# Patient Record
Sex: Female | Born: 1955 | Race: Black or African American | Hispanic: No | Marital: Single | Smoking: Former smoker
Health system: Southern US, Community
[De-identification: ages and names within clinical notes are randomized; demographics above are authoritative.]

## PROBLEM LIST (undated history)

## (undated) DIAGNOSIS — Z5189 Encounter for other specified aftercare: Secondary | ICD-10-CM

## (undated) DIAGNOSIS — E119 Type 2 diabetes mellitus without complications: Secondary | ICD-10-CM

## (undated) DIAGNOSIS — I82409 Acute embolism and thrombosis of unspecified deep veins of unspecified lower extremity: Secondary | ICD-10-CM

## (undated) DIAGNOSIS — M549 Dorsalgia, unspecified: Secondary | ICD-10-CM

## (undated) HISTORY — PX: IVC FILTER PLACEMENT (ARMC HX): HXRAD1551

## (undated) HISTORY — PX: ABDOMINAL HYSTERECTOMY: SHX81

## (undated) HISTORY — PX: FRACTURE SURGERY: SHX138

## (undated) HISTORY — PX: CHOLECYSTECTOMY: SHX55

## (undated) HISTORY — DX: Encounter for other specified aftercare: Z51.89

---

## 2014-12-25 ENCOUNTER — Encounter (HOSPITAL_COMMUNITY): Payer: Self-pay | Admitting: Emergency Medicine

## 2014-12-25 ENCOUNTER — Emergency Department (HOSPITAL_COMMUNITY): Payer: Medicare Other

## 2014-12-25 ENCOUNTER — Emergency Department (HOSPITAL_COMMUNITY)
Admission: EM | Admit: 2014-12-25 | Discharge: 2014-12-25 | Disposition: A | Discharge status: Home / Self Care | Payer: Medicare Other | Attending: Emergency Medicine | Admitting: Emergency Medicine

## 2014-12-25 DIAGNOSIS — I82409 Acute embolism and thrombosis of unspecified deep veins of unspecified lower extremity: Secondary | ICD-10-CM | POA: Insufficient documentation

## 2014-12-25 DIAGNOSIS — Z79899 Other long term (current) drug therapy: Secondary | ICD-10-CM | POA: Insufficient documentation

## 2014-12-25 DIAGNOSIS — K115 Sialolithiasis: Secondary | ICD-10-CM | POA: Diagnosis not present

## 2014-12-25 DIAGNOSIS — I509 Heart failure, unspecified: Secondary | ICD-10-CM | POA: Insufficient documentation

## 2014-12-25 DIAGNOSIS — Z87891 Personal history of nicotine dependence: Secondary | ICD-10-CM | POA: Insufficient documentation

## 2014-12-25 DIAGNOSIS — E119 Type 2 diabetes mellitus without complications: Secondary | ICD-10-CM | POA: Diagnosis not present

## 2014-12-25 DIAGNOSIS — Z792 Long term (current) use of antibiotics: Secondary | ICD-10-CM | POA: Insufficient documentation

## 2014-12-25 DIAGNOSIS — R221 Localized swelling, mass and lump, neck: Secondary | ICD-10-CM | POA: Diagnosis present

## 2014-12-25 HISTORY — DX: Dorsalgia, unspecified: M54.9

## 2014-12-25 HISTORY — DX: Acute embolism and thrombosis of unspecified deep veins of unspecified lower extremity: I82.409

## 2014-12-25 HISTORY — DX: Type 2 diabetes mellitus without complications: E11.9

## 2014-12-25 LAB — CBC WITH DIFFERENTIAL/PLATELET
Basophils Absolute: 0 10*3/uL (ref 0.0–0.1)
Basophils Relative: 0 % (ref 0–1)
Eosinophils Absolute: 0 10*3/uL (ref 0.0–0.7)
Eosinophils Relative: 0 % (ref 0–5)
HEMATOCRIT: 37.8 % (ref 36.0–46.0)
Hemoglobin: 12.3 g/dL (ref 12.0–15.0)
LYMPHS ABS: 2.5 10*3/uL (ref 0.7–4.0)
LYMPHS PCT: 28 % (ref 12–46)
MCH: 29.9 pg (ref 26.0–34.0)
MCHC: 32.5 g/dL (ref 30.0–36.0)
MCV: 91.7 fL (ref 78.0–100.0)
MONO ABS: 0.7 10*3/uL (ref 0.1–1.0)
Monocytes Relative: 8 % (ref 3–12)
Neutro Abs: 5.7 10*3/uL (ref 1.7–7.7)
Neutrophils Relative %: 64 % (ref 43–77)
PLATELETS: 212 10*3/uL (ref 150–400)
RBC: 4.12 MIL/uL (ref 3.87–5.11)
RDW: 17.8 % — ABNORMAL HIGH (ref 11.5–15.5)
WBC: 8.9 10*3/uL (ref 4.0–10.5)

## 2014-12-25 LAB — PROTIME-INR
INR: 1.87 — ABNORMAL HIGH (ref 0.00–1.49)
Prothrombin Time: 21.4 seconds — ABNORMAL HIGH (ref 11.6–15.2)

## 2014-12-25 LAB — BASIC METABOLIC PANEL
Anion gap: 8 (ref 5–15)
BUN: 14 mg/dL (ref 6–20)
CALCIUM: 9.5 mg/dL (ref 8.9–10.3)
CHLORIDE: 105 mmol/L (ref 101–111)
CO2: 24 mmol/L (ref 22–32)
Creatinine, Ser: 1.3 mg/dL — ABNORMAL HIGH (ref 0.44–1.00)
GFR calc Af Amer: 51 mL/min — ABNORMAL LOW (ref >60)
GFR, EST NON AFRICAN AMERICAN: 44 mL/min — AB (ref >60)
Glucose, Bld: 173 mg/dL — ABNORMAL HIGH (ref 65–99)
POTASSIUM: 4.1 mmol/L (ref 3.5–5.1)
SODIUM: 137 mmol/L (ref 135–145)

## 2014-12-25 MED ORDER — IOHEXOL 300 MG/ML  SOLN
75.0000 mL | Freq: Once | INTRAMUSCULAR | Status: AC | PRN
  Administered 2014-12-25: 75 mL via INTRAVENOUS

## 2014-12-25 MED ORDER — SODIUM CHLORIDE 0.9 % IV SOLN
3.0000 g | Freq: Once | INTRAVENOUS | Status: AC
  Administered 2014-12-25: 3 g via INTRAVENOUS
  Filled 2014-12-25 (×2): qty 3

## 2014-12-25 MED ORDER — ONDANSETRON HCL 4 MG/2ML IJ SOLN
4.0000 mg | Freq: Once | INTRAMUSCULAR | Status: AC
  Administered 2014-12-25: 4 mg via INTRAVENOUS
  Filled 2014-12-25: qty 2

## 2014-12-25 MED ORDER — MORPHINE SULFATE 4 MG/ML IJ SOLN
4.0000 mg | INTRAMUSCULAR | Status: DC | PRN
  Administered 2014-12-25: 4 mg via INTRAVENOUS
  Filled 2014-12-25: qty 1

## 2014-12-25 MED ORDER — AMOXICILLIN-POT CLAVULANATE 875-125 MG PO TABS: 1.0000 | ORAL_TABLET | Freq: Two times a day (BID) | ORAL | Status: DC

## 2014-12-25 NOTE — ED Notes (Signed)
Requested meds from pharmacy  

## 2014-12-25 NOTE — ED Notes (Signed)
Off unit with CT 

## 2014-12-25 NOTE — ED Notes (Signed)
Pt returned to room from CT

## 2014-12-25 NOTE — ED Notes (Signed)
Pt states swelling in her neck and throat, was on antibiotics for same 4 weeks ago, swelling was relieved but came back, pt tongue swollen, pt SATS 99% RA, no distress, states painful 8/10.  Significant swelling noted, tenderness apon swallowing.

## 2014-12-25 NOTE — ED Notes (Signed)
Pt presents with significant swelling to R sided and middle neck, jaw, and tongue; pt reports painful swallowing and difficulty swallowing; pt denies sob at this time; pt reports ongoing problem- was seen at [facility] yesterday for same and made appointment at ENT for 01/08/15 but pt reports cannot wait that long

## 2014-12-25 NOTE — ED Provider Notes (Addendum)
CSN: [id number]     Arrival date & time 12/25/14  0540 History   First MD Initiated Contact with Patient 12/25/14 [phone]     Chief Complaint  Patient presents with  . Facial Swelling      HPI  Patient presents for evaluation of right facial and neck swelling.  She reports symptoms of swelling and pain below her right mandible for the last 3-4 weeks. Seen at urgent care several weeks ago. Was placed on 10 days of antibiotics. Her symptoms got better but not completely resolved. She has pain with chewing and eating. No difficulty swallowing. She is not drooling. She able to drink without difficulty. She feels discomfort in the floor of her mouth. No fever.  Past Medical History  Diagnosis Date  . CHF (congestive heart failure)   . DVT (deep venous thrombosis)   . Back pain   . Diabetes mellitus without complication    Past Surgical History  Procedure Laterality Date  . Cesarean section    . Abdominal hysterectomy    . Fracture surgery      left ankle  . Cholecystectomy     History reviewed. No pertinent family history. History  Substance Use Topics  . Smoking status: Former Games developer  . Smokeless tobacco: Not on file  . Alcohol Use: No   OB History    No data available     Review of Systems  Constitutional: Negative for fever, chills, diaphoresis, appetite change and fatigue.  HENT: Negative for mouth sores, sore throat and trouble swallowing.        Pain and soft tissue swelling to the face inferior to the mandible and tenderness in the floor the mouth. Tongue is not elevated. It is no drooling or stridorous.  Eyes: Negative for visual disturbance.  Respiratory: Negative for cough, chest tightness, shortness of breath and wheezing.   Cardiovascular: Negative for chest pain.  Gastrointestinal: Negative for nausea, vomiting, abdominal pain, diarrhea and abdominal distention.  Endocrine: Negative for polydipsia, polyphagia and polyuria.  Genitourinary: Negative for dysuria,  frequency and hematuria.  Musculoskeletal: Negative for gait problem.  Skin: Negative for color change, pallor and rash.  Neurological: Negative for dizziness, syncope, light-headedness and headaches.  Hematological: Does not bruise/bleed easily.  Psychiatric/Behavioral: Negative for behavioral problems and confusion.      Allergies  Bactrim  Home Medications   Prior to Admission medications   Medication Sig Start Date End Date Taking? Authorizing Provider  amoxicillin-clavulanate (AUGMENTIN) 875-125 MG per tablet Take 1 tablet by mouth 2 (two) times daily. 12/25/14   Rolland Porter, MD  cyclobenzaprine (FLEXERIL) 10 MG tablet Take 10-20 mg by mouth daily.   Yes Historical Provider, MD  furosemide (LASIX) 20 MG tablet Take 20 mg by mouth daily.   Yes Historical Provider, MD  gabapentin (NEURONTIN) 300 MG capsule Take 300 mg by mouth 2 (two) times daily.   Yes Historical Provider, MD  metFORMIN (GLUCOPHAGE) 500 MG tablet Take 500 mg by mouth 2 (two) times daily. 10/27/14  Yes Historical Provider, MD  morphine (MS CONTIN) 15 MG 12 hr tablet Take 15 mg by mouth 2 (two) times daily as needed. 12/02/14  Yes Historical Provider, MD  oxyCODONE-acetaminophen (PERCOCET) 10-325 MG per tablet Take 1-2 tablets by mouth daily. 12/02/14  Yes Historical Provider, MD  warfarin (COUMADIN) 4 MG tablet Take 8 mg by mouth daily.   Yes Historical Provider, MD   BP 145/77 mmHg  Pulse 95  Temp(Src) 99.9 F (37.7 C) (Oral)  Resp 20  Ht  (1.702 m)  Wt 208 lb (94.348 kg)  BMI 32.57 kg/m2  SpO2 91% Physical Exam  Constitutional: She is oriented to person, place, and time. She appears well-developed and well-nourished. No distress.  HENT:  Head: Normocephalic.    Mouth/Throat:    Eyes: Conjunctivae are normal. Pupils are equal, round, and reactive to light. No scleral icterus.  Neck: Normal range of motion. Neck supple. No thyromegaly present.  Cardiovascular: Normal rate and regular rhythm.  Exam  reveals no gallop and no friction rub.   No murmur heard. Pulmonary/Chest: Effort normal and breath sounds normal. No respiratory distress. She has no wheezes. She has no rales.  Abdominal: Soft. Bowel sounds are normal. She exhibits no distension. There is no tenderness. There is no rebound.  Musculoskeletal: Normal range of motion.  Neurological: She is alert and oriented to person, place, and time.  Skin: Skin is warm and dry. No rash noted.  Psychiatric: She has a normal mood and affect. Her behavior is normal.    ED Course  Procedures (including critical care time) Labs Review Labs Reviewed  CBC WITH DIFFERENTIAL/PLATELET - Abnormal; Notable for the following:    RDW 17.8 (*)    All other components within normal limits  BASIC METABOLIC PANEL - Abnormal; Notable for the following:    Glucose, Bld 173 (*)    Creatinine, Ser 1.30 (*)    GFR calc non Af Amer 44 (*)    GFR calc Af Amer 51 (*)    All other components within normal limits  PROTIME-INR - Abnormal; Notable for the following:    Prothrombin Time 21.4 (*)    INR 1.87 (*)    All other components within normal limits    Imaging Review Ct Soft Tissue Neck W Contrast  12/25/2014   CLINICAL DATA:  Right-sided neck pain and swelling.  EXAM: CT NECK WITH CONTRAST  TECHNIQUE: Multidetector CT imaging of the neck was performed using the standard protocol following the bolus administration of intravenous contrast.  CONTRAST:  75mL OMNIPAQUE IOHEXOL 300 MG/ML  SOLN  COMPARISON:  None.  FINDINGS: Pharynx and larynx: Normal.  Salivary glands: Parotid glands and left submandibular gland appear normal. Right submandibular gland is enlarged with surrounding inflammation. This appears to be due to at least 2 stones in the salivary duct causing mild obstruction. The largest measures approximately 4 mm.  Thyroid: Normal.  Lymph nodes: No significant adenopathy is noted.  Vascular: Visualized vasculature appears normal.  Limited intracranial:  No abnormality seen.  Visualized orbits: Normal.  Mastoids and visualized paranasal sinuses: Normal.  Skeleton: Multilevel degenerative disc disease is noted in lower cervical spine.  Upper chest: No abnormality seen.  IMPRESSION: At least 2 salivary duct stones are noted on the right side resulting in obstruction, enlargement and inflammation of the right submandibular gland.   Electronically Signed   By: Lupita Raider, M.D.   On: 12/25/2014 10:17     EKG Interpretation None      MDM   Final diagnoses:  Sialolithiasis    No sign of abscess. Does not appear septic or toxic. No fever. Given IV antibiotic. Plan will be continued by mouth antibiotic. Plan continued care with her outpatient referral to Dr. Jearld Fenton of ENT, arranged for August 12.   Sialogogues with  lemon drops or other tart candies. Pain control. Recheck here as needed.    Rolland Porter, MD 12/25/14 1130  Rolland Porter, MD 12/25/14 1318

## 2014-12-25 NOTE — Discharge Instructions (Signed)
Take half of your regular Coumadin dose while you're on the antibiotic. (  daily) Augmentin prescription. Keep your appointment with Dr. Jearld Fenton of ENT on August 12. Use sour candies or lemon drops to promote salivation. Place these under your tongue. INR check in 7 days with your primary care physician. [facility] to [facility] as we discussed.  Salivary Stone Your exam shows you have a stone in one of your saliva glands. These small stones form around a mucous plug in the ducts of the glands and cause the saliva in the gland to be blocked. This makes the gland swollen and painful, especially when you eat. If repeated episodes occur, the gland can become infected. Sometimes these stones can be seen on x-ray. Treatment includes stimulating the production of saliva to push the stone out. You should suck on a lemon or sour candies several times daily. Antibiotic medicine may be needed if the gland is infected. Increasing fluids, applying warm compresses to the swollen area 3-4 times daily, and massaging the gland from back to front may encourage drainage and passage of the stone. Surgical treatment to remove the stone is sometimes necessary, so proper medical follow up is very important. Call your doctor for an appointment as recommended. Call right away if you have a high fever, severe headache, vomiting, uncontrolled pain, or other serious symptoms. Document Released: 06/22/2004 Document Revised: 08/07/2011 Document Reviewed: 05/15/2005 [facility] Patient Information 2015 [place], [place]. This information is not intended to replace advice given to you by your health care provider. Make sure you discuss any questions you have with your health care provider.

## 2015-01-01 ENCOUNTER — Inpatient Hospital Stay (HOSPITAL_COMMUNITY)
Admission: EM | Admit: 2015-01-01 | Discharge: 2015-01-03 | DRG: 155 | Disposition: A | Discharge status: Home / Self Care | Payer: Medicare Other | Attending: Family Medicine | Admitting: Family Medicine

## 2015-01-01 ENCOUNTER — Encounter (HOSPITAL_COMMUNITY): Payer: Self-pay | Admitting: Emergency Medicine

## 2015-01-01 DIAGNOSIS — N179 Acute kidney failure, unspecified: Secondary | ICD-10-CM | POA: Diagnosis present

## 2015-01-01 DIAGNOSIS — Z7901 Long term (current) use of anticoagulants: Secondary | ICD-10-CM

## 2015-01-01 DIAGNOSIS — E119 Type 2 diabetes mellitus without complications: Secondary | ICD-10-CM | POA: Diagnosis present

## 2015-01-01 DIAGNOSIS — Z87891 Personal history of nicotine dependence: Secondary | ICD-10-CM | POA: Diagnosis not present

## 2015-01-01 DIAGNOSIS — I509 Heart failure, unspecified: Secondary | ICD-10-CM | POA: Diagnosis present

## 2015-01-01 DIAGNOSIS — G8929 Other chronic pain: Secondary | ICD-10-CM | POA: Diagnosis present

## 2015-01-01 DIAGNOSIS — K115 Sialolithiasis: Secondary | ICD-10-CM | POA: Diagnosis present

## 2015-01-01 DIAGNOSIS — R509 Fever, unspecified: Secondary | ICD-10-CM | POA: Insufficient documentation

## 2015-01-01 DIAGNOSIS — Z79899 Other long term (current) drug therapy: Secondary | ICD-10-CM | POA: Diagnosis not present

## 2015-01-01 DIAGNOSIS — R609 Edema, unspecified: Secondary | ICD-10-CM | POA: Diagnosis present

## 2015-01-01 DIAGNOSIS — N183 Chronic kidney disease, stage 3 unspecified: Secondary | ICD-10-CM | POA: Insufficient documentation

## 2015-01-01 DIAGNOSIS — M549 Dorsalgia, unspecified: Secondary | ICD-10-CM | POA: Diagnosis present

## 2015-01-01 DIAGNOSIS — Z86718 Personal history of other venous thrombosis and embolism: Secondary | ICD-10-CM

## 2015-01-01 DIAGNOSIS — K1121 Acute sialoadenitis: Principal | ICD-10-CM | POA: Diagnosis present

## 2015-01-01 LAB — CBC WITH DIFFERENTIAL/PLATELET
Basophils Absolute: 0 10*3/uL (ref 0.0–0.1)
Basophils Relative: 0 % (ref 0–1)
Eosinophils Absolute: 0.1 10*3/uL (ref 0.0–0.7)
Eosinophils Relative: 1 % (ref 0–5)
HCT: 37.5 % (ref 36.0–46.0)
Hemoglobin: 12.1 g/dL (ref 12.0–15.0)
Lymphocytes Relative: 24 % (ref 12–46)
Lymphs Abs: 1.8 10*3/uL (ref 0.7–4.0)
MCH: 29.8 pg (ref 26.0–34.0)
MCHC: 32.3 g/dL (ref 30.0–36.0)
MCV: 92.4 fL (ref 78.0–100.0)
MONO ABS: 0.5 10*3/uL (ref 0.1–1.0)
Monocytes Relative: 7 % (ref 3–12)
Neutro Abs: 5.3 10*3/uL (ref 1.7–7.7)
Neutrophils Relative %: 68 % (ref 43–77)
PLATELETS: 224 10*3/uL (ref 150–400)
RBC: 4.06 MIL/uL (ref 3.87–5.11)
RDW: 17.4 % — AB (ref 11.5–15.5)
WBC: 7.7 10*3/uL (ref 4.0–10.5)

## 2015-01-01 LAB — I-STAT CHEM 8, ED
BUN: 11 mg/dL (ref 6–20)
CALCIUM ION: 1.22 mmol/L (ref 1.12–1.23)
Chloride: 102 mmol/L (ref 101–111)
Creatinine, Ser: 1.2 mg/dL — ABNORMAL HIGH (ref 0.44–1.00)
Glucose, Bld: 159 mg/dL — ABNORMAL HIGH (ref 65–99)
HEMATOCRIT: 40 % (ref 36.0–46.0)
Hemoglobin: 13.6 g/dL (ref 12.0–15.0)
Potassium: 3.7 mmol/L (ref 3.5–5.1)
Sodium: 141 mmol/L (ref 135–145)
TCO2: 25 mmol/L (ref 0–100)

## 2015-01-01 LAB — PROTIME-INR
INR: 1.16 (ref 0.00–1.49)
Prothrombin Time: 14.9 seconds (ref 11.6–15.2)

## 2015-01-01 MED ORDER — WARFARIN - PHARMACIST DOSING INPATIENT
Freq: Every day | Status: DC
  Administered 2015-01-02: 18:00:00

## 2015-01-01 MED ORDER — FENTANYL CITRATE (PF) 100 MCG/2ML IJ SOLN
100.0000 ug | Freq: Once | INTRAMUSCULAR | Status: AC
  Administered 2015-01-01: 100 ug via INTRAVENOUS
  Filled 2015-01-01: qty 2

## 2015-01-01 MED ORDER — OXYCODONE HCL 5 MG PO TABS: 5.0000 mg | ORAL_TABLET | ORAL | Status: DC | PRN

## 2015-01-01 MED ORDER — ACETAMINOPHEN 650 MG RE SUPP
650.0000 mg | Freq: Four times a day (QID) | RECTAL | Status: DC | PRN
Start: 2015-01-01 — End: 2015-01-03

## 2015-01-01 MED ORDER — ACETAMINOPHEN 325 MG PO TABS: 650.0000 mg | ORAL_TABLET | Freq: Four times a day (QID) | ORAL | Status: DC | PRN

## 2015-01-01 MED ORDER — CLINDAMYCIN PHOSPHATE 600 MG/50ML IV SOLN
600.0000 mg | Freq: Once | INTRAVENOUS | Status: AC
  Administered 2015-01-01: 600 mg via INTRAVENOUS
  Filled 2015-01-01: qty 50

## 2015-01-01 MED ORDER — METFORMIN HCL 500 MG PO TABS: 500.0000 mg | ORAL_TABLET | Freq: Two times a day (BID) | ORAL | Status: DC

## 2015-01-01 MED ORDER — OXYCODONE-ACETAMINOPHEN 5-325 MG PO TABS
ORAL_TABLET | ORAL | Status: AC
  Filled 2015-01-01: qty 1

## 2015-01-01 MED ORDER — GABAPENTIN 300 MG PO CAPS
300.0000 mg | ORAL_CAPSULE | Freq: Two times a day (BID) | ORAL | Status: DC
  Administered 2015-01-01 – 2015-01-03 (×4): 300 mg via ORAL
  Filled 2015-01-01 (×4): qty 1

## 2015-01-01 MED ORDER — SODIUM CHLORIDE 0.9 % IV SOLN
INTRAVENOUS | Status: DC
  Administered 2015-01-01 – 2015-01-03 (×2): via INTRAVENOUS

## 2015-01-01 MED ORDER — WARFARIN SODIUM 4 MG PO TABS
8.0000 mg | ORAL_TABLET | Freq: Once | ORAL | Status: AC
  Administered 2015-01-02: 8 mg via ORAL
  Filled 2015-01-01: qty 2

## 2015-01-01 MED ORDER — OXYCODONE-ACETAMINOPHEN 5-325 MG PO TABS
1.0000 | ORAL_TABLET | Freq: Once | ORAL | Status: AC
  Administered 2015-01-01: 1 via ORAL

## 2015-01-01 MED ORDER — OXYCODONE HCL 5 MG PO TABS
5.0000 mg | ORAL_TABLET | ORAL | Status: DC | PRN
  Administered 2015-01-01 – 2015-01-03 (×8): 10 mg via ORAL
  Filled 2015-01-01 (×8): qty 2

## 2015-01-01 MED ORDER — ONDANSETRON HCL 4 MG/2ML IJ SOLN: 4.0000 mg | Freq: Once | INTRAMUSCULAR | Status: DC

## 2015-01-01 MED ORDER — SODIUM CHLORIDE 0.9 % IV BOLUS (SEPSIS)
1000.0000 mL | Freq: Once | INTRAVENOUS | Status: AC
  Administered 2015-01-01: 1000 mL via INTRAVENOUS

## 2015-01-01 MED ORDER — CLINDAMYCIN PHOSPHATE 600 MG/50ML IV SOLN
600.0000 mg | Freq: Three times a day (TID) | INTRAVENOUS | Status: DC
  Administered 2015-01-02 – 2015-01-03 (×6): 600 mg via INTRAVENOUS
  Filled 2015-01-01 (×7): qty 50

## 2015-01-01 NOTE — H&P (Signed)
Family Medicine Teaching [facility] Admission History and Physical Service Pager: [phone]  Patient name: Megan Duarte Medical record number: [id number] Date of birth: [birth year] Age: 59 y.o. Gender: female  Primary Care Provider: No primary care provider on file. Consultants: ENT Code Status: FULL  Chief Complaint: infection of R submandibular gland stones   Assessment and Plan: Megan Duarte is a 59 y.o. female presenting with infection of R submandibular gland stones . PMH is significant for Type II DM, CHF, recurrent DVF, and chronic back pain.   Infection of R submandibular Sialolith - Pt has already failed course of 10 day course of Augmentin in July. Presented to ED one week ago and received IV abx but was not admitted and outpatient f/u with ENT was scheduled. CT performed (7/29) showed stones in the R submandibular duct. Afebrile, WBC 7.7, vitals WNL. Received one dose IV clinda in ED. Seen by ENT already. They feel the stones cannot be managed until infection clears.  - Admit to floor, attending Dr. Randolm Idol - Cont IV clinda 600 mg - Cont IVF (reduce rate when taking PO) - F/u abscess culture  - Per ENT, encourage pt to massage R submandibular gland frequently to express drainage  - Warm compress to effected site, Q4hr while awake - Oxycodone 5-10 mg q4 PRN pain - BMP, CBC in AM  AKI - Cr 1.2 on presentation. No history of renal disease. Pt reports she has not had anything to eat or drink in over 24 hours due to pain on swallowing. Potentially secondary to dehydration from poor PO intake, however BUN:Cr is <20 making this less likely. - Continue IVF - correct electrolytes as needed - CMP pending; BMP in AM - Hold lasix if Cr continues to bump  Type II DM - home metformin 500 mg BID - Cont home metformin, for now >> hold if GFR drops - SSI - Monitor CBGs  CHF - does not see a cardiologist regularly. Meds prescribed by her former PCP.  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 INR 1.87 - Warfarin per pharm - PT/INR  Chronic back pain - scheduled for surgery at [facility] on 01/10/15 - Continue home gabapentin 300 mg BID  FEN/GI: NS 125 mL/hr, clear liquids Prophylaxis: home coumadin  Disposition: admit to med surg  History of Present Illness: Megan Duarte is a 60 y.o. female presenting with infection of R submandibular gland stones.   Problem began over a month ago. Pt went to urgent care about six weeks ago with jaw/mouth pain. She was told she had an infected salivary gland, and was given a ten day course of amoxicillin. Her condition did not improve after completing the amoxicillin course, and she presented to urgent care again last week with worsening pain. She reports that she was told she needed to come to the ED since she had already failed PO treatment.   At our ED one week ago, she received IV abx and a neck CT neck, which showed stones in the R submandibular duct. She was told to return if her condition worsened. She reports that the floor of her mouth began to swell, so much that it was pushing her tongue to the roof of her mouth, so she came to the ED today. She reports feeling as though she has a migraine, like someone has pulled her wisdom teeth out, and cut her tongue with a knife. She has  pain especially in her tongue every time she moves her tongue or tries to talk. She does report a couple episodes of wheezing and feeling as if her throat is narrowing, but this is transient and she does not feel this way currently. She also reports feeling like something is stuck in her throat intermittently. She reports a hoarse voice started two days ago.   Review Of Systems: Per HPI with the following additions: denies abdominal pain, chest pain, difficulty breathing, palpitations; endorses back pain, increased LE edema  Otherwise 12 point review of systems was performed and  was unremarkable.  Patient Active Problem List   Diagnosis Date Noted  . Sialolithiasis of submandibular gland 01/01/2015   Past Medical History: Past Medical History  Diagnosis Date  . CHF (congestive heart failure)   . DVT (deep venous thrombosis)   . Back pain   . Diabetes mellitus without complication    Past Surgical History: Past Surgical History  Procedure Laterality Date  . Cesarean section    . Abdominal hysterectomy    . Fracture surgery      left ankle  . Cholecystectomy     Social History: History  Substance Use Topics  . Smoking status: Former Games developer  . Smokeless tobacco: Not on file  . Alcohol Use: No   Additional social history: former smoker; no EtOH or illicit drug use Please also refer to relevant sections of EMR.  Family History: History reviewed. No pertinent family history. Allergies and Medications: Allergies  Allergen Reactions  . Bactrim [Sulfamethoxazole-Trimethoprim] Itching   No current facility-administered medications on file prior to encounter.   Current Outpatient Prescriptions on File Prior to Encounter  Medication Sig Dispense Refill  . amoxicillin-clavulanate (AUGMENTIN) 875-125 MG per tablet Take 1 tablet by mouth 2 (two) times daily. 28 tablet 0  . cyclobenzaprine (FLEXERIL) 10 MG tablet Take 10-20 mg by mouth daily.    . furosemide (LASIX) 20 MG tablet Take 20 mg by mouth daily.    Marland Kitchen gabapentin (NEURONTIN) 300 MG capsule Take 300 mg by mouth 2 (two) times daily.    . metFORMIN (GLUCOPHAGE) 500 MG tablet Take 500 mg by mouth 2 (two) times daily.  0  . oxyCODONE-acetaminophen (PERCOCET) 10-325 MG per tablet Take 1-2 tablets by mouth every 6 (six) hours as needed for pain.   0  . warfarin (COUMADIN) 4 MG tablet Take 8 mg by mouth daily.      Objective: BP 136/75 mmHg  Pulse 78  Temp(Src) 98.7 F (37.1 C) (Oral)  Resp 20  SpO2 98% Exam: General: lying in bed, appears uncomfortable but in NAD, bottle of clear foamy  saliva/pus next to bed Eyes: PERRLA ENTM: tongue deviated to L side of mouth due to edema of floor of mouth, purulent fluid easily expressed from R submandibular duct Neck: R upper neck very edematous and indurated esp in area of submandibular gland, very tender to palpation, no signs of tracheal deviation or airway compromise. Cardiovascular: RRR, no murmurs appreciated Respiratory: CTAB, hoarse voice but no wheezing/stridor, non-labored breathing Abdomen: soft, non-tender, non-distended, +BS, surgical scars from past abdominal surgeries (to remove scar tissue she says) MSK: normal range of motion Skin: warm and dry, no rashes noted Neuro: A&Ox3, no focal deficits, CN II-XII intact  Psych: appropriate mood and affect   Labs and Imaging: CBC BMET   Recent Labs Lab 01/01/15 1753 01/01/15 1809  WBC 7.7  --   HGB 12.1 13.6  HCT 37.5 40.0  PLT 224  --  Recent Labs Lab 01/01/15 1809  NA 141  K 3.7  CL 102  BUN 11  CREATININE 1.20*  GLUCOSE 159*     No results found.  Marquette Saa, MD 01/01/2015, 9:17 PM PGY-1, [facility] FPTS Intern pager: [phone], text pages welcome   Upper Level Addendum:  I have seen and evaluated this patient along with Dr. Natale Milch and reviewed the above note, making necessary revisions in red.   Kathee Delton, MD,MS,  PGY2 01/01/2015 11:28 PM

## 2015-01-01 NOTE — Progress Notes (Signed)
ANTICOAGULATION CONSULT NOTE - Initial Consult  Pharmacy Consult for Coumadin Indication: Hx of DVTs  Allergies  Allergen Reactions  . Bactrim [Sulfamethoxazole-Trimethoprim] Itching    Patient Measurements: Height:  (170.2 cm) Weight: 203 lb 6.4 oz (92.262 kg) IBW/kg (Calculated) : 61.6  Vital Signs: Temp: 100.5 F (38.1 C) (08/05 2200) Temp Source: Oral (08/05 2200) BP: 131/62 mmHg (08/05 2200) Pulse Rate: 89 (08/05 2200)  Labs:  Recent Labs  01/01/15 1753 01/01/15 1809  HGB 12.1 13.6  HCT 37.5 40.0  PLT 224  --   LABPROT 14.9  --   INR 1.16  --   CREATININE  --  1.20*    Estimated Creatinine Clearance: 58.9 mL/min (by C-G formula based on Cr of 1.2).   Medical History: Past Medical History  Diagnosis Date  . CHF (congestive heart failure)   . DVT (deep venous thrombosis)   . Back pain   . Diabetes mellitus without complication     Assessment: 59 yo F presents on 8/5 with infection of R submandibular gland stones. Pt on coumadin PTA for history of DVTs. CBC stable. INR on admit is subtherapeutic at 1.16.  PTA Coumadin  daily  **However patient has been on  daily per MD since 7/29 as she has been on augmentin  Goal of Therapy:  INR 2-3 Monitor platelets by anticoagulation protocol: Yes   Plan:  Give coumadin  PO x 1 tonight Monitor daily INR, CBC, s/s of bleed  Antolin Belsito J 01/01/2015,10:31 PM

## 2015-01-01 NOTE — ED Notes (Signed)
Pt seen here for same last week c/o facial swelling and throat and face pain on right side; pt sts painful to swallow and has HA

## 2015-01-01 NOTE — Consult Note (Addendum)
Reason for Consult:Right submandibular gland stones, infection Referring Physician: ER  Megan Duarte is an 59 y.o. female.  HPI: 59 year old female who developed right submandibular zone edema about one month ago that worsened.  She also developed pain.  She was evaluated in the ER one week ago and a CT scan demonstrated a couple of stones in the right submandibular duct and she was treated with Augmentin with follow-up with ENT planned.  She has taken the medication for the past week but symptoms have worsened including worsened pain and swelling and she is having difficulty swallowing.  She denies breathing difficulty.  Past Medical History  Diagnosis Date  . CHF (congestive heart failure)   . DVT (deep venous thrombosis)   . Back pain   . Diabetes mellitus without complication     Past Surgical History  Procedure Laterality Date  . Cesarean section    . Abdominal hysterectomy    . Fracture surgery      left ankle  . Cholecystectomy      History reviewed. No pertinent family history.  Social History:  reports that she has quit smoking. She does not have any smokeless tobacco history on file. She reports that she does not drink alcohol or use illicit drugs.  Allergies:  Allergies  Allergen Reactions  . Bactrim [Sulfamethoxazole-Trimethoprim] Itching    Medications: I have reviewed the patient's current medications.  Results for orders placed or performed during the hospital encounter of 01/01/15 (from the past 48 hour(s))  CBC with Differential     Status: Abnormal   Collection Time: 01/01/15  5:53 PM  Result Value Ref Range   WBC 7.7 4.0 - 10.5 K/uL   RBC 4.06 3.87 - 5.11 MIL/uL   Hemoglobin 12.1 12.0 - 15.0 g/dL   HCT 16.1 09.6 - 04.5 %   MCV 92.4 78.0 - 100.0 fL   MCH 29.8 26.0 - 34.0 pg   MCHC 32.3 30.0 - 36.0 g/dL   RDW 40.9 (H) 81.1 - 91.4 %   Platelets 224 150 - 400 K/uL   Neutrophils Relative % 68 43 - 77 %   Neutro Abs 5.3 1.7 - 7.7 K/uL   Lymphocytes  Relative 24 12 - 46 %   Lymphs Abs 1.8 0.7 - 4.0 K/uL   Monocytes Relative 7 3 - 12 %   Monocytes Absolute 0.5 0.1 - 1.0 K/uL   Eosinophils Relative 1 0 - 5 %   Eosinophils Absolute 0.1 0.0 - 0.7 K/uL   Basophils Relative 0 0 - 1 %   Basophils Absolute 0.0 0.0 - 0.1 K/uL  I-Stat Chem 8, ED     Status: Abnormal   Collection Time: 01/01/15  6:09 PM  Result Value Ref Range   Sodium 141 135 - 145 mmol/L   Potassium 3.7 3.5 - 5.1 mmol/L   Chloride 102 101 - 111 mmol/L   BUN 11 6 - 20 mg/dL   Creatinine, Ser 7.82 (H) 0.44 - 1.00 mg/dL   Glucose, Bld 956 (H) 65 - 99 mg/dL   Calcium, Ion 2.13 0.86 - 1.23 mmol/L   TCO2 25 0 - 100 mmol/L   Hemoglobin 13.6 12.0 - 15.0 g/dL   HCT 57.8 46.9 - 62.9 %    No results found.  Review of Systems  HENT: Positive for sore throat.   All other systems reviewed and are negative.  Blood pressure 136/75, pulse 78, temperature 98.7 F (37.1 C), temperature source Oral, resp. rate 20, SpO2 98 %.  Physical Exam  Constitutional: She is oriented to person, place, and time. She appears well-developed and well-nourished.  Crying due to pain.  HENT:  Head: Normocephalic and atraumatic.  Right Ear: External ear normal.  Left Ear: External ear normal.  Nose: Nose normal.  TMs clear.  Floor of mouth mildly edematous with purulent fluid easily expressed from right submandibular duct, culture obtained.  Eyes: Conjunctivae and EOM are normal. Pupils are equal, round, and reactive to light.  Neck: Normal range of motion.  Right upper neck diffuse edema, submandibular gland firm, edematous, tender.  Cardiovascular: Normal rate.   Respiratory: Effort normal.  No stridor.  Normal voice.  Musculoskeletal: Normal range of motion.  Neurological: She is alert and oriented to person, place, and time. No cranial nerve deficit.  Skin: Skin is warm and dry.  Psychiatric: She has a normal mood and affect. Her behavior is normal. Judgment and thought content normal.     Assessment/Plan: Right submandibular sialoadenitis, sialolithiasis I discussed her case with the ER doctor and recommended admission to the hospitalist service for IV hydration, IV antibiotics, and management of diabetes.  I recommended changing to IV clindamycin.  The stones cannot be managed until infection clears.  A culture was obtained from the pus that was expressed from the right submandibular duct and will be sent.  Will follow.  In order to encourage drainage, she should be encouraged to massage the right submandibular gland frequently, apply heat to the area several times per day, hydrate well, and suck on lemon drops frequently.  Megan Duarte 01/01/2015, 6:47 PM

## 2015-01-01 NOTE — ED Notes (Signed)
Report called to rn on 5n rn

## 2015-01-01 NOTE — ED Notes (Signed)
Culture  Sent to lab

## 2015-01-01 NOTE — ED Notes (Signed)
The pt was given pain meds

## 2015-01-01 NOTE — ED Notes (Signed)
Unsuccessful iv start

## 2015-01-01 NOTE — ED Notes (Signed)
ent here.

## 2015-01-01 NOTE — ED Notes (Signed)
Swollen under her rt jaw./  She has a salivary duct stone and she has had since the 29th of July when she was seen here

## 2015-01-02 ENCOUNTER — Encounter (HOSPITAL_COMMUNITY): Payer: Self-pay | Admitting: *Deleted

## 2015-01-02 DIAGNOSIS — N183 Chronic kidney disease, stage 3 unspecified: Secondary | ICD-10-CM | POA: Insufficient documentation

## 2015-01-02 LAB — URINE MICROSCOPIC-ADD ON

## 2015-01-02 LAB — CBC
HCT: 32.8 % — ABNORMAL LOW (ref 36.0–46.0)
HEMOGLOBIN: 10.6 g/dL — AB (ref 12.0–15.0)
MCH: 29.8 pg (ref 26.0–34.0)
MCHC: 32.3 g/dL (ref 30.0–36.0)
MCV: 92.1 fL (ref 78.0–100.0)
Platelets: 224 10*3/uL (ref 150–400)
RBC: 3.56 MIL/uL — ABNORMAL LOW (ref 3.87–5.11)
RDW: 17.7 % — AB (ref 11.5–15.5)
WBC: 7.6 10*3/uL (ref 4.0–10.5)

## 2015-01-02 LAB — COMPREHENSIVE METABOLIC PANEL
ALBUMIN: 3.3 g/dL — AB (ref 3.5–5.0)
ALT: 21 U/L (ref 14–54)
ANION GAP: 8 (ref 5–15)
AST: 20 U/L (ref 15–41)
Alkaline Phosphatase: 94 U/L (ref 38–126)
BILIRUBIN TOTAL: 0.5 mg/dL (ref 0.3–1.2)
BUN: 10 mg/dL (ref 6–20)
CALCIUM: 8.9 mg/dL (ref 8.9–10.3)
CO2: 26 mmol/L (ref 22–32)
CREATININE: 1.2 mg/dL — AB (ref 0.44–1.00)
Chloride: 106 mmol/L (ref 101–111)
GFR calc Af Amer: 56 mL/min — ABNORMAL LOW (ref >60)
GFR, EST NON AFRICAN AMERICAN: 48 mL/min — AB (ref >60)
Glucose, Bld: 173 mg/dL — ABNORMAL HIGH (ref 65–99)
Potassium: 3.6 mmol/L (ref 3.5–5.1)
SODIUM: 140 mmol/L (ref 135–145)
Total Protein: 6 g/dL — ABNORMAL LOW (ref 6.5–8.1)

## 2015-01-02 LAB — BASIC METABOLIC PANEL
ANION GAP: 11 (ref 5–15)
BUN: 9 mg/dL (ref 6–20)
CO2: 21 mmol/L — ABNORMAL LOW (ref 22–32)
Calcium: 8.5 mg/dL — ABNORMAL LOW (ref 8.9–10.3)
Chloride: 105 mmol/L (ref 101–111)
Creatinine, Ser: 1.18 mg/dL — ABNORMAL HIGH (ref 0.44–1.00)
GFR calc Af Amer: 57 mL/min — ABNORMAL LOW (ref >60)
GFR, EST NON AFRICAN AMERICAN: 49 mL/min — AB (ref >60)
Glucose, Bld: 122 mg/dL — ABNORMAL HIGH (ref 65–99)
Potassium: 3.8 mmol/L (ref 3.5–5.1)
Sodium: 137 mmol/L (ref 135–145)

## 2015-01-02 LAB — URINALYSIS, ROUTINE W REFLEX MICROSCOPIC
Bilirubin Urine: NEGATIVE
GLUCOSE, UA: 250 mg/dL — AB
KETONES UR: NEGATIVE mg/dL
LEUKOCYTES UA: NEGATIVE
NITRITE: NEGATIVE
PH: 6 (ref 5.0–8.0)
Protein, ur: 30 mg/dL — AB
SPECIFIC GRAVITY, URINE: 1.013 (ref 1.005–1.030)
Urobilinogen, UA: 1 mg/dL (ref 0.0–1.0)

## 2015-01-02 LAB — GLUCOSE, CAPILLARY
GLUCOSE-CAPILLARY: 196 mg/dL — AB (ref 65–99)
GLUCOSE-CAPILLARY: 147 mg/dL — AB (ref 65–99)
Glucose-Capillary: 137 mg/dL — ABNORMAL HIGH (ref 65–99)
Glucose-Capillary: 146 mg/dL — ABNORMAL HIGH (ref 65–99)

## 2015-01-02 LAB — PROTIME-INR
INR: 1.27 (ref 0.00–1.49)
Prothrombin Time: 16.1 seconds — ABNORMAL HIGH (ref 11.6–15.2)

## 2015-01-02 MED ORDER — WARFARIN SODIUM 4 MG PO TABS
8.0000 mg | ORAL_TABLET | Freq: Once | ORAL | Status: AC
Start: 2015-01-02 — End: 2015-01-02
  Administered 2015-01-02: 8 mg via ORAL
  Filled 2015-01-02: qty 2

## 2015-01-02 MED ORDER — INSULIN ASPART 100 UNIT/ML ~~LOC~~ SOLN
0.0000 [IU] | Freq: Three times a day (TID) | SUBCUTANEOUS | Status: DC
  Administered 2015-01-02: 2 [IU] via SUBCUTANEOUS
  Administered 2015-01-02 (×2): 1 [IU] via SUBCUTANEOUS
  Administered 2015-01-03 (×3): 2 [IU] via SUBCUTANEOUS

## 2015-01-02 MED ORDER — FLUCONAZOLE 150 MG PO TABS
150.0000 mg | ORAL_TABLET | Freq: Every day | ORAL | Status: DC
  Administered 2015-01-02 – 2015-01-03 (×2): 150 mg via ORAL
  Filled 2015-01-02 (×3): qty 1

## 2015-01-02 NOTE — Progress Notes (Signed)
ANTICOAGULATION CONSULT NOTE - Follow Up Consult  Pharmacy Consult for coumadin Indication: Hx of DVTs  Allergies  Allergen Reactions  . Bactrim [Sulfamethoxazole-Trimethoprim] Itching    Patient Measurements: Height:  (170.2 cm) Weight: 203 lb 6.4 oz (92.262 kg) IBW/kg (Calculated) : 61.6   Vital Signs: Temp: 98.8 F (37.1 C) (08/06 0532) Temp Source: Oral (08/06 0532) BP: 104/63 mmHg (08/06 0532) Pulse Rate: 89 (08/06 0532)  Labs:  Recent Labs  01/01/15 1753 01/01/15 1809 01/02/15 0028 01/02/15 0358  HGB 12.1 13.6  --  10.6*  HCT 37.5 40.0  --  32.8*  PLT 224  --   --  224  LABPROT 14.9  --   --  16.1*  INR 1.16  --   --  1.27  CREATININE  --  1.20* 1.20* 1.18*    Estimated Creatinine Clearance: 59.9 mL/min (by C-G formula based on Cr of 1.18).   Assessment: 58yo F admitted 01/01/2015 with infection of R submandibular gland stones, tx with Augmentin outpatient since 7/29. Pt on Coumadin PTA for hx of DVTs. INR on admit subtherapeutic 1.16. Umbrella filter placed in 2000. Current INR 1.27, Hgb 13.6>10.6, No s/sx of bleeding noted.  PTA Coumadin dose: 8 mg daily (Pt had been on  daily per MD since 7/29 since she had been on Augmentin)   Goal of Therapy:  INR 2-3 Monitor platelets by anticoagulation protocol: Yes   Plan:  - Will give warfarin  x1 tonight - Daily INR, CBC - Monitor for s/sx of bleeding  Casilda Carls, PharmD. Clinical Pharmacist Resident Pager: [phone]

## 2015-01-02 NOTE — Progress Notes (Signed)
Family Medicine Teaching Service Daily Progress Note Intern Pager: [phone]  Patient name: Megan Duarte Medical record number: [id number] Date of birth: Jun 11, 1955 Age: 59 y.o. Gender: female  Primary Care Provider: No primary care provider on file. Consultants: ENT Code Status: Full  Pt Overview and Major Events to Date:  8/5: admitted for submandibular gland infection likely 2/2 sialolith  Assessment and Plan: Megan Duarte is a 59 y.o. female presenting with infection of R submandibular gland stones. PMH is significant for Type II DM, CHF, recurrent DVF, and chronic back pain.   # Infection of R submandibular Sialolith -  failed OP course of Augmentin. Presented to ED one week ago, dxd w/ sialolithiasis and scheduled for outpatient f/u with ENT. CT (7/29) showed stones in the R submandibular duct. Presentation: Afebrile, WBC 7.7, vitals WNL. Seen by ENT in ED. Awaiting further direction by surgeons. - Cont IV clinda 600 mg - Cont IVF >> decrease to KVO - F/u abscess culture  - ENT to f/u >> appreciate recs  - Warm compress to effected site, Q4hr while awake - Oxycodone 5-10 mg q4 PRN pain >> increase if necessary - Monitor CBC/BMP  # AKI vs CKD3 - Cr 1.2 on presentation. No history of renal disease. BUN:Cr is <20 making this less likely. This may be her baseline renal function.  - KVO IVF now that diet advanced - correct electrolytes as needed - Hold lasix - Monitor BMPs  # Type II DM - home metformin 500 mg BID - Hold home metformin >> consider DC at DC if renal function doesn't improve - SSI - Monitor CBGs  # CHF - does not see a cardiologist regularly. Meds prescribed by her former PCP.  - hold home Lasix 20 mg - Strict I/Os  # Recurrent DVTs - umbrella filter placed in 2000 after recurrent DVTs. INR 1.27 - Warfarin per pharm >> may need to change if surg plans to operate   # Chronic back pain - scheduled for surgery at [facility] on 01/10/15 - Continue  home gabapentin 300 mg BID  FEN/GI: KVO, Carb mod Prophylaxis: home coumadin  Disposition: pending status improvement  Subjective:  Patient doing well this AM. No acute events overnight. Notes some increased pain at the angle of the mandible and just inferior to the ear of the effected side.  Objective: Temp:  [98.7 F (37.1 C)-100.5 F (38.1 C)] 98.8 F (37.1 C) (08/06 0532) Pulse Rate:  [78-95] 89 (08/06 0532) Resp:  [18-20] 20 (08/06 0532) BP: (104-150)/(62-86) 104/63 mmHg (08/06 0532) SpO2:  [94 %-99 %] 94 % (08/06 0532) Weight:  [203 lb 6.4 oz (92.262 kg)] 203 lb 6.4 oz (92.262 kg) (08/05 2200) Physical Exam: General: lying in bed, appears uncomfortable but in NAD, pleasant  HEENT: PERRLA, purulent fluid easily expressed from R submandibular duct, R submandibular tissue edematous and indurated, TTP, trachea midline Cardiovascular: RRR, no murmurs appreciated Respiratory: CTAB, non-labored breathing Abdomen: soft, non-tender, non-distended, +BS, surgical scars from past abdominal surgeries Skin: warm and dry, no rashes noted Neuro: A&Ox3, no focal deficits, CN II-XII intact   Laboratory:  Recent Labs Lab 01/01/15 1753 01/01/15 1809 01/02/15 0358  WBC 7.7  --  7.6  HGB 12.1 13.6 10.6*  HCT 37.5 40.0 32.8*  PLT 224  --  224    Recent Labs Lab 01/01/15 1809 01/02/15 0028 01/02/15 0358  NA 141 140 137  K 3.7 3.6 3.8  CL 102 106 105  CO2  --  26 21*  BUN 11 10 9   CREATININE 1.20* 1.20* 1.18*  CALCIUM  --  8.9 8.5*  PROT  --  6.0*  --   BILITOT  --  0.5  --   ALKPHOS  --  94  --   ALT  --  21  --   AST  --  20  --   GLUCOSE 159* 173* 122*   A1c pending  Imaging/Diagnostic Tests: CT soft tissue neck 7/29 IMPRESSION: At least 2 salivary duct stones are noted on the right side resulting in obstruction, enlargement and inflammation of the right submandibular gland.   Kathee Delton, MD 01/02/2015, 10:32 AM PGY-2, [facility] FPTS Intern  pager: [phone], text pages welcome

## 2015-01-02 NOTE — ED Provider Notes (Signed)
CSN: [id number]     Arrival date & time 01/01/15  1422 History   First MD Initiated Contact with Patient 01/01/15 1719     Chief Complaint  Patient presents with  . Facial Swelling     (Consider location/radiation/quality/duration/timing/severity/associated sxs/prior Treatment) The history is provided by the patient.   patient presents with facial swelling. Was seen 1 week ago and diagnosed with a salivary gland stone with possible infection. Has been on oral anti-biotics. She was feeling better but then began to feel worse. Now is unable to swallow her secretions. States some pain with swallowing. No difficult breathing. She has follow-up with ENT, Dr. Jearld Fenton in 1 week. She is still on Augmentin. States she has had some chills.   Past Medical History  Diagnosis Date  . CHF (congestive heart failure)   . DVT (deep venous thrombosis)   . Back pain   . Diabetes mellitus without complication    Past Surgical History  Procedure Laterality Date  . Cesarean section    . Abdominal hysterectomy    . Fracture surgery      left ankle  . Cholecystectomy     History reviewed. No pertinent family history. History  Substance Use Topics  . Smoking status: Former Games developer  . Smokeless tobacco: Not on file  . Alcohol Use: No   OB History    No data available     Review of Systems  Constitutional: Negative for activity change and appetite change.  HENT: Positive for facial swelling and trouble swallowing. Negative for sore throat.   Eyes: Negative for pain.  Respiratory: Negative for chest tightness and shortness of breath.   Cardiovascular: Negative for chest pain and leg swelling.  Gastrointestinal: Negative for nausea, vomiting, abdominal pain and diarrhea.  Genitourinary: Negative for flank pain.  Musculoskeletal: Negative for back pain and neck stiffness.  Skin: Negative for rash.  Neurological: Negative for weakness, numbness and headaches.  Psychiatric/Behavioral: Negative for  behavioral problems.      Allergies  Bactrim  Home Medications   Prior to Admission medications   Medication Sig Start Date End Date Taking? Authorizing Provider  amoxicillin-clavulanate (AUGMENTIN) 875-125 MG per tablet Take 1 tablet by mouth 2 (two) times daily. 12/25/14  Yes Rolland Porter, MD  cyclobenzaprine (FLEXERIL) 10 MG tablet Take 10-20 mg by mouth daily.   Yes Historical Provider, MD  furosemide (LASIX) 20 MG tablet Take 20 mg by mouth daily.   Yes Historical Provider, MD  gabapentin (NEURONTIN) 300 MG capsule Take 300 mg by mouth 2 (two) times daily.   Yes Historical Provider, MD  metFORMIN (GLUCOPHAGE) 500 MG tablet Take 500 mg by mouth 2 (two) times daily. 10/27/14  Yes Historical Provider, MD  oxyCODONE-acetaminophen (PERCOCET) 10-325 MG per tablet Take 1-2 tablets by mouth every 6 (six) hours as needed for pain.  12/02/14  Yes Historical Provider, MD  warfarin (COUMADIN) 4 MG tablet Take 8 mg by mouth daily.   Yes Historical Provider, MD   BP 131/62 mmHg  Pulse 89  Temp(Src) 100.5 F (38.1 C) (Oral)  Resp 18  Ht 5\' 7"  (1.702 m)  Wt 203 lb 6.4 oz (92.262 kg)  BMI 31.85 kg/m2  SpO2 99% Physical Exam  Constitutional: She appears well-developed and well-nourished.  HENT:  Tender to the right submandibular area. There is a firm mass approximately 2 x 3 cm. With palpation of the mass there is some purulence expressed from the salivary gland and the mouth.  Eyes: EOM are normal.  Neck: Neck supple. No thyromegaly present.  Cardiovascular: Normal rate.   Pulmonary/Chest: Effort normal.  Abdominal: Soft.  Musculoskeletal: Normal range of motion.  Neurological: She is alert.  Skin: Skin is warm.    ED Course  Procedures (including critical care time) Labs Review Labs Reviewed  CBC WITH DIFFERENTIAL/PLATELET - Abnormal; Notable for the following:    RDW 17.4 (*)    All other components within normal limits  I-STAT CHEM 8, ED - Abnormal; Notable for the following:     Creatinine, Ser 1.20 (*)    Glucose, Bld 159 (*)    All other components within normal limits  CULTURE, ROUTINE-ABSCESS  PROTIME-INR  BASIC METABOLIC PANEL  CBC  PROTIME-INR  COMPREHENSIVE METABOLIC PANEL    Imaging Review No results found.   EKG Interpretation None      MDM   Final diagnoses:  Sialolithiasis of submandibular gland  patient with sialolithiasis and sialoadenitis. Failure of outpatient N biotics. Seen by ENT in the ER. Admit to internal medicine. No need for re-CT scan at this time.     Benjiman Core, MD 01/02/15 [phone]

## 2015-01-02 NOTE — Progress Notes (Signed)
Utilization review completed.  

## 2015-01-03 LAB — CBC
HCT: 31 % — ABNORMAL LOW (ref 36.0–46.0)
Hemoglobin: 10.1 g/dL — ABNORMAL LOW (ref 12.0–15.0)
MCH: 30 pg (ref 26.0–34.0)
MCHC: 32.6 g/dL (ref 30.0–36.0)
MCV: 92 fL (ref 78.0–100.0)
Platelets: 208 10*3/uL (ref 150–400)
RBC: 3.37 MIL/uL — ABNORMAL LOW (ref 3.87–5.11)
RDW: 17.6 % — ABNORMAL HIGH (ref 11.5–15.5)
WBC: 5.2 10*3/uL (ref 4.0–10.5)

## 2015-01-03 LAB — BASIC METABOLIC PANEL
ANION GAP: 8 (ref 5–15)
BUN: 12 mg/dL (ref 6–20)
CALCIUM: 8.6 mg/dL — AB (ref 8.9–10.3)
CO2: 23 mmol/L (ref 22–32)
Chloride: 105 mmol/L (ref 101–111)
Creatinine, Ser: 1.16 mg/dL — ABNORMAL HIGH (ref 0.44–1.00)
GFR calc Af Amer: 58 mL/min — ABNORMAL LOW (ref >60)
GFR calc non Af Amer: 50 mL/min — ABNORMAL LOW (ref >60)
Glucose, Bld: 181 mg/dL — ABNORMAL HIGH (ref 65–99)
Potassium: 3.9 mmol/L (ref 3.5–5.1)
SODIUM: 136 mmol/L (ref 135–145)

## 2015-01-03 LAB — GLUCOSE, CAPILLARY
Glucose-Capillary: 173 mg/dL — ABNORMAL HIGH (ref 65–99)
Glucose-Capillary: 168 mg/dL — ABNORMAL HIGH (ref 65–99)
Glucose-Capillary: 163 mg/dL — ABNORMAL HIGH (ref 65–99)

## 2015-01-03 LAB — PROTIME-INR
INR: 1.48 (ref 0.00–1.49)
PROTHROMBIN TIME: 18 s — AB (ref 11.6–15.2)

## 2015-01-03 MED ORDER — WARFARIN SODIUM 4 MG PO TABS
8.0000 mg | ORAL_TABLET | Freq: Once | ORAL | Status: AC
  Administered 2015-01-03: 8 mg via ORAL
  Filled 2015-01-03: qty 2

## 2015-01-03 MED ORDER — CLINDAMYCIN HCL 300 MG PO CAPS: 600.0000 mg | ORAL_CAPSULE | Freq: Three times a day (TID) | ORAL | Status: AC

## 2015-01-03 NOTE — Progress Notes (Signed)
Family Medicine Teaching Service Daily Progress Note Intern Pager: [phone]  Patient name: Megan Duarte Medical record number: [id number] Date of birth: 1955-09-25 Age: 59 y.o. Gender: female  Primary Care Provider: No primary care provider on file. Consultants: ENT Code Status: Full  Pt Overview and Major Events to Date:  8/5: admitted for submandibular gland infection likely 2/2 sialolith   ABX/Culture Clindamycin 8/5>>  Abscess cx: 8/5>>pending   Assessment and Plan: Megan Duarte is a 59 y.o. female presenting with infection of R submandibular gland stones. PMH is significant for Type II DM, CHF, recurrent DVF, and chronic back pain.   # Infection of R submandibular Sialolith: slight improvement. Pain is controled with medication and she is able to eat and drink.  - ENT to f/u >> appreciate recs  - Cont IV clinda 600 mg, consider switching to keflex 500 mg QID but will wait for now for ENT rec's  - Warm compress to effected site, Q4hr while awake - Oxycodone 5-10 mg q4 PRN pain >> increase if necessary - Monitor CBC/BMP  # CKD3 - improving renal function  - KVO  - correct electrolytes as needed - Hold lasix - Monitor BMPs  # Type II DM - home metformin 500 mg BID - Hold home metformin  - SSI - Monitor CBGs  # CHF - does not see a cardiologist regularly.  - May need to limit her intake if she has systolic HF  - hold home Lasix 20 mg - Strict I/Os  # Recurrent DVTs - umbrella filter placed in 2000 after recurrent DVTs. INR 1.27 - Warfarin per pharm >> may need to change if surg plans to operate   # Chronic back pain - scheduled for surgery at [facility] on 01/10/15 - Continue home gabapentin 300 mg BID  FEN/GI: KVO, Carb mod Prophylaxis: home coumadin  Disposition: pending status improvement  Subjective:  Felt nauseous after she took pain medication but feels better now. Still complaining of vaginal itching.   Objective: Temp:  [98.2 F (36.8  C)-98.8 F (37.1 C)] 98.8 F (37.1 C) (08/06 2100) Pulse Rate:  [65-81] 65 (08/07 0618) Resp:  [16-20] 16 (08/07 0618) BP: (100-116)/(66-70) 100/66 mmHg (08/07 0618) SpO2:  [98 %-99 %] 99 % (08/07 0618) Weight:  [216 lb 1.6 oz (98.022 kg)] 216 lb 1.6 oz (98.022 kg) (08/07 0545) Physical Exam: General: lying in bed, appears uncomfortable but in NAD, pleasant  HEENT:  R submandibular tissue edematous and indurated, TTP Cardiovascular: RRR, no murmurs appreciated Respiratory: CTAB, non-labored breathing Abdomen: soft, non-tender, non-distended,  Skin: warm and dry, no rashes noted Neuro: A&Ox3, no focal deficits  Laboratory:  Recent Labs Lab 01/01/15 1753 01/01/15 1809 01/02/15 0358 01/03/15 0545  WBC 7.7  --  7.6 5.2  HGB 12.1 13.6 10.6* 10.1*  HCT 37.5 40.0 32.8* 31.0*  PLT 224  --  224 208    Recent Labs Lab 01/02/15 0028 01/02/15 0358 01/03/15 0545  NA 140 137 136  K 3.6 3.8 3.9  CL 106 105 105  CO2 26 21* 23  BUN 10 9 12   CREATININE 1.20* 1.18* 1.16*  CALCIUM 8.9 8.5* 8.6*  PROT 6.0*  --   --   BILITOT 0.5  --   --   ALKPHOS 94  --   --   ALT 21  --   --   AST 20  --   --   GLUCOSE 173* 122* 181*   A1c pending  Imaging/Diagnostic Tests: CT soft tissue  neck 7/29 IMPRESSION: At least 2 salivary duct stones are noted on the right side resulting in obstruction, enlargement and inflammation of the right submandibular gland.   Myra Rude, MD 01/03/2015, 8:04 AM PGY-3, [facility] FPTS Intern pager: [phone], text pages welcome

## 2015-01-03 NOTE — Progress Notes (Signed)
  Subjective: She has noticed improvement in pain and swelling in the right upper neck.  She is drinking fluids easily.  Objective: Vital signs in last 24 hours: Temp:  [98.3 F (36.8 C)-98.8 F (37.1 C)] 98.3 F (36.8 C) (08/07 1331) Pulse Rate:  [65-79] 73 (08/07 1331) Resp:  [16-20] 17 (08/07 1331) BP: (100-127)/(66-81) 127/81 mmHg (08/07 1331) SpO2:  [98 %-99 %] 98 % (08/07 1331) Weight:  [98.022 kg (216 lb 1.6 oz)] 98.022 kg (216 lb 1.6 oz) (08/07 0545) Last BM Date:  (PTA- patient uncertain)  Intake/Output from previous day: 08/06 0701 - 08/07 0700 In: 2675 [P.O.:900; I.V.:1725; IV Piggyback:50] Out: 2200 [Urine:2200] Intake/Output this shift: Total I/O In: 600 [P.O.:600] Out: -   General appearance: alert, cooperative and no distress Neck: Right submandibular gland remains tender, firm and edematous but surrounding tenderness and edema have improved.  Lab Results:   Recent Labs  01/02/15 0358 01/03/15 0545  WBC 7.6 5.2  HGB 10.6* 10.1*  HCT 32.8* 31.0*  PLT 224 208   BMET  Recent Labs  01/02/15 0358 01/03/15 0545  NA 137 136  K 3.8 3.9  CL 105 105  CO2 21* 23  GLUCOSE 122* 181*  BUN 9 12  CREATININE 1.18* 1.16*  CALCIUM 8.5* 8.6*   PT/INR  Recent Labs  01/02/15 0358 01/03/15 0545  LABPROT 16.1* 18.0*  INR 1.27 1.48   ABG No results for input(s): PHART, HCO3 in the last 72 hours.  Invalid input(s): PCO2, PO2  Studies/Results: No results found.  Anti-infectives: Anti-infectives    Start     Dose/Rate Route Frequency Ordered Stop   01/02/15 1100  fluconazole (DIFLUCAN) tablet 150 mg     150 mg Oral Daily 01/02/15 0952     01/01/15 1900  clindamycin (CLEOCIN) IVPB 600 mg     600 mg 100 mL/hr over 30 Minutes Intravenous  Once 01/01/15 1855 01/01/15 2047   01/01/15 0300  clindamycin (CLEOCIN) IVPB 600 mg     600 mg 100 mL/hr over 30 Minutes Intravenous Every 8 hours 01/01/15 2228        Assessment/Plan: Right submandibular  sialoadenitis, sialolithiasis Improving noticeably.  I spoke with Family Medicine and recommended that she can be discharged on oral clindamycin.  I advised the patient to massage the gland regularly, use a heating pad several times per day, drink plenty of fluids, and suck on sour candies.  I will arrange follow-up.  LOS: 2 days    Jarmaine Ehrler 01/03/2015

## 2015-01-03 NOTE — Discharge Summary (Signed)
Family Medicine Teaching [facility] Discharge Summary  Patient name: Megan Duarte Medical record number: [id number] Date of birth: [birth year] Age: 59 y.o. Gender: female Date of Admission: 01/01/2015  Date of Discharge: 01/03/15 Admitting Physician: Uvaldo Rising, MD  Primary Care Provider: No primary care provider on file. Consultants: ENT  Indication for Hospitalization: Sialoadenitis  Discharge Diagnoses/Problem List:  Right submandibular Sialoadenitis, sialolithiasis CKD Stage 3 Type 2 DM CHF Recurrent DVTs, on warfarin Chronic back pain  Disposition: home  Discharge Condition: stable, improved  Brief Hospital Course:  Megan Duarte is a 59 y.o. female that was admitted for right submandibular sialoadenitis/lithiasis. Patient previously treated for this with amoxicillin but did not improve, so deemed as failing outpatient treatment and admitted for IV antibiotics. She was observed for 2 days on IV clindamycin with significant improvement of symptoms. ENT evaluated and determined she was stable for discharge on oral clindamycin with outpatient follow up for removal of sialolith.   Issues for Follow Up:  1. Sialolith: ENT follow up, continue clindamycin.  Significant Procedures: none  Significant Labs and Imaging:   Recent Labs Lab 01/01/15 1753 01/01/15 1809 01/02/15 0358 01/03/15 0545  WBC 7.7  --  7.6 5.2  HGB 12.1 13.6 10.6* 10.1*  HCT 37.5 40.0 32.8* 31.0*  PLT 224  --  224 208    Recent Labs Lab 01/01/15 1809 01/02/15 0028 01/02/15 0358 01/03/15 0545  NA 141 140 137 136  K 3.7 3.6 3.8 3.9  CL 102 106 105 105  CO2  --  26 21* 23  GLUCOSE 159* 173* 122* 181*  BUN 11 10 9 12   CREATININE 1.20* 1.20* 1.18* 1.16*  CALCIUM  --  8.9 8.5* 8.6*  ALKPHOS  --  94  --   --   AST  --  20  --   --   ALT  --  21  --   --   ALBUMIN  --  3.3*  --   --     Ct Soft Tissue Neck W Contrast  12/25/2014   CLINICAL DATA:  Right-sided neck pain and swelling.   EXAM: CT NECK WITH CONTRAST  TECHNIQUE: Multidetector CT imaging of the neck was performed using the standard protocol following the bolus administration of intravenous contrast.  CONTRAST:  75mL OMNIPAQUE IOHEXOL 300 MG/ML  SOLN  COMPARISON:  None.  FINDINGS: Pharynx and larynx: Normal.  Salivary glands: Parotid glands and left submandibular gland appear normal. Right submandibular gland is enlarged with surrounding inflammation. This appears to be due to at least 2 stones in the salivary duct causing mild obstruction. The largest measures approximately 4 mm.  Thyroid: Normal.  Lymph nodes: No significant adenopathy is noted.  Vascular: Visualized vasculature appears normal.  Limited intracranial: No abnormality seen.  Visualized orbits: Normal.  Mastoids and visualized paranasal sinuses: Normal.  Skeleton: Multilevel degenerative disc disease is noted in lower cervical spine.  Upper chest: No abnormality seen.  IMPRESSION: At least 2 salivary duct stones are noted on the right side resulting in obstruction, enlargement and inflammation of the right submandibular gland.   Electronically Signed   By: Lupita Raider, M.D.   On: 12/25/2014 10:17   Results/Tests Pending at Time of Discharge: none  Discharge Medications:    Medication List    STOP taking these medications        amoxicillin-clavulanate 875-125 MG per tablet  Commonly known as:  AUGMENTIN      TAKE these medications  clindamycin 300 MG capsule  Commonly known as:  CLEOCIN  Take 2 capsules (600 mg total) by mouth 3 (three) times daily.     cyclobenzaprine 10 MG tablet  Commonly known as:  FLEXERIL  Take 10-20 mg by mouth daily.     furosemide 20 MG tablet  Commonly known as:  LASIX  Take 20 mg by mouth daily.     gabapentin 300 MG capsule  Commonly known as:  NEURONTIN  Take 300 mg by mouth 2 (two) times daily.     metFORMIN 500 MG tablet  Commonly known as:  GLUCOPHAGE  Take 500 mg by mouth 2 (two) times daily.      oxyCODONE-acetaminophen 10-325 MG per tablet  Commonly known as:  PERCOCET  Take 1-2 tablets by mouth every 6 (six) hours as needed for pain.     warfarin 4 MG tablet  Commonly known as:  COUMADIN  Take 8 mg by mouth daily.        Discharge Instructions: Please refer to Patient Instructions section of EMR for full details.  Patient was counseled important signs and symptoms that should prompt return to medical care, changes in medications, dietary instructions, activity restrictions, and follow up appointments.   Follow-Up Appointments:     Follow-up Information    Follow up with BATES, DWIGHT, MD.   Specialty:  Otolaryngology   Why:  you should be getting a phone call from ENT office for an appt   Contact information:   [address] [place] [address] [phone]       Nani Ravens, MD 01/03/2015, 3:42 PM PGY-3, [facility]

## 2015-01-03 NOTE — Progress Notes (Signed)
Patient given discharge instructions; prescription was called into pharmacy.  Patient denies questions.  Patient trying to arrange transportation home.  Will continue to monitor.

## 2015-01-03 NOTE — Progress Notes (Signed)
ANTICOAGULATION CONSULT NOTE - Follow Up Consult  Pharmacy Consult for coumadin Indication: Hx of DVTs  Allergies  Allergen Reactions  . Bactrim [Sulfamethoxazole-Trimethoprim] Itching    Patient Measurements: Height:  (170.2 cm) Weight: 216 lb 1.6 oz (98.022 kg) IBW/kg (Calculated) : 61.6   Vital Signs: Temp: 98.8 F (37.1 C) (08/06 2100) Temp Source: Oral (08/06 2100) BP: 100/66 mmHg (08/07 0618) Pulse Rate: 65 (08/07 0618)  Labs:  Recent Labs  01/01/15 1753  01/01/15 1809 01/02/15 0028 01/02/15 0358 01/03/15 0545  HGB 12.1  --  13.6  --  10.6* 10.1*  HCT 37.5  --  40.0  --  32.8* 31.0*  PLT 224  --   --   --  224 208  LABPROT 14.9  --   --   --  16.1* 18.0*  INR 1.16  --   --   --  1.27 1.48  CREATININE  --   < > 1.20* 1.20* 1.18* 1.16*  < > = values in this interval not displayed.  Estimated Creatinine Clearance: 62 mL/min (by C-G formula based on Cr of 1.16).   Assessment: 58yo F admitted 01/01/2015 with infection of R submandibular gland stones, tx with Augmentin outpatient since 7/29. Pt on Coumadin PTA for hx of DVTs. INR on admit subtherapeutic 1.16. Umbrella filter placed in 2000. Current INR 1.48 increasing, Hgb trending down 10.1, No s/sx of bleeding noted.  PTA Coumadin dose: 8 mg daily (Pt had been on  daily per MD since 7/29 since she had been on Augmentin)   DDI with Diflucan: Fluconazole may increase INR and chance of bleeding.  Goal of Therapy:  INR 2-3 Monitor platelets by anticoagulation protocol: Yes   Plan:  - Will give warfarin  x1 tonight - Daily INR, CBC - Monitor for s/sx of bleeding - DDI with Fluconazole; may increase INR, will continue to monitor closely  Casilda Carls, PharmD. Clinical Pharmacist Resident Pager: [phone]

## 2015-01-03 NOTE — Progress Notes (Signed)
97.7 kg weighed bed incorrectly didn't take off extra pillows and covers off bed

## 2015-01-03 NOTE — Discharge Instructions (Signed)
Continue taking the clindamycin: 2 tablets 3 times a day until you run out or are told to stop. You should be getting a phone call from the ENT doctor for follow up.   If you continue to have vaginal discharge/itching you should contact your primary doctor. You were giving oral diflucan to help treat a possible yeast infection.

## 2015-01-04 ENCOUNTER — Encounter: Payer: Self-pay | Admitting: Family Medicine

## 2015-01-04 LAB — HEMOGLOBIN A1C
HEMOGLOBIN A1C: 7.3 % — AB (ref 4.8–5.6)
Mean Plasma Glucose: 163 mg/dL

## 2015-01-06 LAB — CULTURE, ROUTINE-ABSCESS: Gram Stain: NONE SEEN

## 2016-04-26 ENCOUNTER — Telehealth: Payer: Self-pay | Admitting: Family Medicine

## 2016-04-26 ENCOUNTER — Ambulatory Visit (INDEPENDENT_AMBULATORY_CARE_PROVIDER_SITE_OTHER): Payer: Medicare Other | Admitting: Emergency Medicine

## 2016-04-26 ENCOUNTER — Ambulatory Visit (INDEPENDENT_AMBULATORY_CARE_PROVIDER_SITE_OTHER): Payer: Medicare Other

## 2016-04-26 VITALS — BP 118/72 | HR 100 | Temp 98.3°F | Ht 67.0 in | Wt 206.0 lb

## 2016-04-26 DIAGNOSIS — R05 Cough: Secondary | ICD-10-CM

## 2016-04-26 DIAGNOSIS — K5903 Drug induced constipation: Secondary | ICD-10-CM | POA: Diagnosis not present

## 2016-04-26 DIAGNOSIS — J069 Acute upper respiratory infection, unspecified: Secondary | ICD-10-CM

## 2016-04-26 DIAGNOSIS — K921 Melena: Secondary | ICD-10-CM | POA: Diagnosis not present

## 2016-04-26 LAB — POCT CBC
Granulocyte percent: 61.4 %G (ref 37–80)
HCT, POC: 34.3 % — AB (ref 37.7–47.9)
Hemoglobin: 11.5 g/dL — AB (ref 12.2–16.2)
LYMPH, POC: 1.9 (ref 0.6–3.4)
MCH, POC: 30.2 pg (ref 27–31.2)
MCHC: 33.4 g/dL (ref 31.8–35.4)
MCV: 90.5 fL (ref 80–97)
MID (CBC): 0.3 (ref 0–0.9)
MPV: 7.7 fL (ref 0–99.8)
POC Granulocyte: 3.5 (ref 2–6.9)
POC LYMPH %: 33.3 % (ref 10–50)
POC MID %: 5.3 % (ref 0–12)
Platelet Count, POC: 170 10*3/uL (ref 142–424)
RBC: 3.79 M/uL — AB (ref 4.04–5.48)
RDW, POC: 16.8 %
WBC: 5.7 10*3/uL (ref 4.6–10.2)

## 2016-04-26 LAB — BASIC METABOLIC PANEL WITH GFR
BUN: 11 mg/dL (ref 7–25)
CHLORIDE: 109 mmol/L (ref 98–110)
CO2: 26 mmol/L (ref 20–31)
Calcium: 9 mg/dL (ref 8.6–10.4)
Creat: 1.19 mg/dL — ABNORMAL HIGH (ref 0.50–0.99)
GFR, Est African American: 57 mL/min — ABNORMAL LOW (ref >=60)
GFR, Est Non African American: 49 mL/min — ABNORMAL LOW (ref >=60)
GLUCOSE: 115 mg/dL — AB (ref 65–99)
POTASSIUM: 3.7 mmol/L (ref 3.5–5.3)
Sodium: 142 mmol/L (ref 135–146)

## 2016-04-26 LAB — HEMOCCULT GUIAC POC 1CARD (OFFICE): Fecal Occult Blood, POC: NEGATIVE

## 2016-04-26 MED ORDER — LACTULOSE 10 GM/15ML PO SOLN: ORAL | 0 refills | Status: DC

## 2016-04-26 MED ORDER — BENZONATATE 100 MG PO CAPS: 200.0000 mg | ORAL_CAPSULE | Freq: Two times a day (BID) | ORAL | 0 refills | Status: DC | PRN

## 2016-04-26 NOTE — Telephone Encounter (Signed)
Called to report stable BMP with patient.  BUN within normal limits.  Likely not having an upper GI bleed.  Left voicemail to call with any questions.  Hadia Minier M. Nadine CountsGottschalk, DO PGY-3, [facility]

## 2016-04-26 NOTE — Progress Notes (Signed)
Subjective: CC: cough, blood in stool NGE:[id number]:Megan Duarte is a 60 y.o. female presenting to clinic today for same day appointment. PCP: Doristine BosworthZoe A Stallings, [place] Concerns today include:  1. Cough Patient reports a cough that became productive yesterday.  She notes that cough, sore throat, headache w/ cough has been present since 11/24.  Has used Nyquil, Mucinex with no improvement.  She reports CP with coughing but no SOB.  She denies sick contacts.  No travel outside the country.  No history of COPD or asthma.  ?History of CHF.  She notes she is no longer being treated for HF.    2. Blood in stool Patient reports that she has had blood in her stool for ~1 month.  She saw a GI provider in [place], where she had a CT scan and GI work up.  She was told she had hemorrhoids.  She reports that large blood clots come out with BM.  She notes that volume of blood is stable from when she saw GI.  She notes that she has not had a BM in 6 days.  She reports bloating.  Denies nausea, vomiting.  She reports that she has been eating small meals that are bland. She is drinking a lot of water.  She reports that she is seen by pain clinic in [place] and is on Oxycodone 10mg  q6 for chronic back pain.  She notes she was on Miralax but discontinued because she did not feel it was working.  She was taking 2 packets daily at that time. 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  She has an Freight forwarderumbrella filter as well.  Of note, patient reports PSurgHx several c-sections and cholecystectomy.   Social History Reviewed: former smoker. FamHx and MedHx reviewed.  Please see EMR.  ROS: Per HPI  Objective: Office vital signs reviewed. BP 118/72 (BP Location: Right Arm, Patient Position: Sitting, Cuff Size: Large)   Pulse 100   Temp 98.3 F (36.8 C) (Oral)   Ht 5\' 7"  (1.702 m)   Wt 206 lb (93.4 kg)   SpO2 95%   BMI 32.26 kg/m   Physical Examination:  General: Awake, alert, overweight, No acute distress HEENT:  Normal    Neck: No masses palpated. No lymphadenopathy. No JVD    Ears: Tympanic membranes intact, normal light reflex, no erythema, no bulging    Eyes: PERRLA, EOMI    Nose: nasal turbinates moist, erythematous, edematous, no purulence    Throat: moist mucus membranes, no erythema, no tonsillar exudate Cardio: regular rate and rhythm, S1S2 heard, no murmurs appreciated Pulm: Global intermittent expiratory wheezes, no rhonchi or rales, normal WOB on room air GI: soft, non-tender, +distended, bowel sounds present x4, abdomen feels full, no peritoneal signs, good rectal tone, palpable internal hemorrhoids, +rectal tags, no frank blood on examination Extremities: warm, well perfused, No edema, cyanosis or clubbing; +2 pulses bilaterally  Dg Chest 2 View  Result Date: 04/26/2016 CLINICAL DATA:  Productive cough.  Sore throat. EXAM: CHEST  2 VIEW COMPARISON:  None. FINDINGS: Both lungs are clear without airspace disease or pulmonary edema. Heart and mediastinum are within normal limits. The trachea is midline. Mild degenerative endplate changes in thoracic spine. No pleural effusions. IMPRESSION: No active cardiopulmonary disease. Electronically Signed   By: Richarda OverlieAdam  Henn M.D.   On: 04/26/2016 11:20   Dg Abd 2 Views  Result Date: 04/26/2016 CLINICAL DATA:  GI bleed. EXAM: ABDOMEN - 2 VIEW COMPARISON:  None. FINDINGS: IVC filter in place. Prior cholecystectomy. Nonobstructive bowel gas pattern. No free air organomegaly. No suspicious calcification. IMPRESSION: No acute findings. Electronically Signed   By: Charlett NoseKevin  Dover M.D.   On: 04/26/2016 11:20   Results for orders placed or performed in visit on 04/26/16 (from the past 24 hour(s))  Hemoccult - 1 Card (office)     Status: None   Collection Time: 04/26/16 11:54 AM  Result Value Ref Range   Fecal Occult Blood, POC Negative Negative   Card #1 Date 04-26-16    Card #2 Fecal Occult Blod, POC     Card #2 Date     Card #3 Fecal Occult Blood, POC      Card #3 Date    POCT CBC     Status: Abnormal   Collection Time: 04/26/16 11:55 AM  Result Value Ref Range   WBC 5.7 4.6 - 10.2 K/uL   Lymph, poc 1.9 0.6 - 3.4   POC LYMPH PERCENT 33.3 10 - 50 %L   MID (cbc) 0.3 0 - 0.9   POC MID % 5.3 0 - 12 %M   POC Granulocyte 3.5 2 - 6.9   Granulocyte percent 61.4 37 - 80 %G   RBC 3.79 (A) 4.04 - 5.48 M/uL   Hemoglobin 11.5 (A) 12.2 - 16.2 g/dL   HCT, POC 16.134.3 (A) 09.637.7 - 47.9 %   MCV 90.5 80 - 97 fL   MCH, POC 30.2 27 - 31.2 pg   MCHC 33.4 31.8 - 35.4 g/dL   RDW, POC 04.516.8 %   Platelet Count, POC 170 142 - 424 K/uL   MPV 7.7 0 - 99.8 fL    Assessment/ Plan: 60 y.o. female   1. Drug-induced constipation.  Concern for possible SBO given length of constipation and abdominal surgery history.  Abdominal films obtained which showed no obstruction but a moderate stool burden. - Reviewed clean out instructions with patient - DG Abd 2 Views - Lactulose 10g q8 prn constipation  2. URI w/ Cough.  Exam remarkable for global intermittent expiratory wheeze. CXR with borderline enlarged heart. No acute cardiopulmonary processes.  Afebrile here with normal vitals.  Tachycardia not present on my physical exam.  She appeared nontoxic and comfortable on exam. - Tessalon perles 200mg  BID prn cough, avoiding anything with codeine, as patient has a pain contract in [place]. - Supportive care - DG Chest 2 View - Return precautions reviewed  3. Hematochezia.  Rectal exam remarkable for external tags and palpable hemorrhoids internally.  No frank blood appreciated on exam.  Fecal occult blood NEGATIVE.  Hgb 11.5.  Stat BMP sent.  Will look for any BUN elevation, which would suggest possible upper GI bleed. - Labs reviewed with patient.  She will see her GI as planned in 2 weeks or sooner if develops worsening bleeding/ S&S of anemia - POCT CBC - BASIC METABOLIC PANEL WITH GFR - Will contact patient with results via telephone - Hemoccult - 1 Card (office) - Strict  return precautions reviewed  Follow up prn. This patient was discussed with Dr Cleta Albertsaub, who agrees with my assessment and plan.   Raliegh IpAshly M Gottschalk, DO PGY-3, [facility]

## 2016-04-26 NOTE — Patient Instructions (Addendum)
You stool card did NOT show blood today, which is reassuring.  You hemoglobin was reasonable.  You showed slight anemia but no need for transfusion at this time.  Your abdominal xray did show a moderate amount of stool.  For this reason, I have prescribed Lactulose.  I recommend that you start with one dose daily.  You can take up to 1 dose every 8 hours as needed for constipation.  If your bleeding becomes more brisk, you start to feel faint, have shortness of breath, are having palpitations, have chest pain or are unable to stay hydrated, I recommend that you be evaluated in the emergency department.  Continue to hydrate well.  See your GI provider as soon as possible.    IF you received an x-ray today, you will receive an [facility]. Please contact [facility] at [phone] with questions or concerns regarding your invoice.   IF you received labwork today, you will receive an invoice from [facility]. Please contact [facility] at [phone] with questions or concerns regarding your invoice.   Our billing staff will not be able to assist you with questions regarding bills from these companies.  You will be contacted with the lab results as soon as they are available. The fastest way to get your results is to activate your My Chart account. Instructions are located on the last page of this paperwork. If you have not heard from us regarding the results in 2 weeks, please contact this office.

## 2016-05-12 ENCOUNTER — Ambulatory Visit: Payer: Self-pay

## 2016-05-12 ENCOUNTER — Ambulatory Visit (INDEPENDENT_AMBULATORY_CARE_PROVIDER_SITE_OTHER): Payer: Medicare Other | Admitting: Physician Assistant

## 2016-05-12 VITALS — BP 120/60 | HR 93 | Temp 98.2°F | Ht 67.0 in | Wt 203.0 lb

## 2016-05-12 DIAGNOSIS — S161XXA Strain of muscle, fascia and tendon at neck level, initial encounter: Secondary | ICD-10-CM | POA: Diagnosis not present

## 2016-05-12 DIAGNOSIS — K5900 Constipation, unspecified: Secondary | ICD-10-CM | POA: Diagnosis not present

## 2016-05-12 MED ORDER — SENNA 8.6 MG PO TABS: 2.0000 | ORAL_TABLET | Freq: Every day | ORAL | 0 refills | Status: DC | PRN

## 2016-05-12 MED ORDER — CYCLOBENZAPRINE HCL 10 MG PO TABS: 10.0000 mg | ORAL_TABLET | Freq: Three times a day (TID) | ORAL | 0 refills | Status: AC | PRN
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
## 2016-09-21 ENCOUNTER — Ambulatory Visit (INDEPENDENT_AMBULATORY_CARE_PROVIDER_SITE_OTHER): Payer: Medicare Other | Admitting: Physician Assistant

## 2016-09-21 ENCOUNTER — Encounter: Payer: Self-pay | Admitting: Physician Assistant

## 2016-09-21 ENCOUNTER — Ambulatory Visit (INDEPENDENT_AMBULATORY_CARE_PROVIDER_SITE_OTHER): Payer: Medicare Other

## 2016-09-21 VITALS — BP 130/81 | HR 79 | Temp 98.5°F | Resp 18 | Ht 67.0 in | Wt 201.0 lb

## 2016-09-21 DIAGNOSIS — R0600 Dyspnea, unspecified: Secondary | ICD-10-CM | POA: Diagnosis not present

## 2016-09-21 DIAGNOSIS — R058 Other specified cough: Secondary | ICD-10-CM

## 2016-09-21 DIAGNOSIS — R05 Cough: Secondary | ICD-10-CM

## 2016-09-21 DIAGNOSIS — J22 Unspecified acute lower respiratory infection: Secondary | ICD-10-CM

## 2016-09-21 LAB — POCT CBC
Granulocyte percent: 50.7 %G (ref 37–80)
HCT, POC: 36.7 % — AB (ref 37.7–47.9)
HEMOGLOBIN: 11.6 g/dL — AB (ref 12.2–16.2)
LYMPH, POC: 2.4 (ref 0.6–3.4)
MCH, POC: 28.7 pg (ref 27–31.2)
MCHC: 31.5 g/dL — AB (ref 31.8–35.4)
MCV: 91 fL (ref 80–97)
MID (cbc): 0.3 (ref 0–0.9)
MPV: 7.5 fL (ref 0–99.8)
POC Granulocyte: 2.8 (ref 2–6.9)
POC LYMPH PERCENT: 44 %L (ref 10–50)
POC MID %: 5.3 %M (ref 0–12)
Platelet Count, POC: 224 10*3/uL (ref 142–424)
RBC: 4.03 M/uL — AB (ref 4.04–5.48)
RDW, POC: 18 %
WBC: 5.5 10*3/uL (ref 4.6–10.2)

## 2016-09-21 MED ORDER — ALBUTEROL SULFATE HFA 108 (90 BASE) MCG/ACT IN AERS: 2.0000 | INHALATION_SPRAY | RESPIRATORY_TRACT | 1 refills | Status: DC | PRN

## 2016-09-21 MED ORDER — BENZONATATE 100 MG PO CAPS: 100.0000 mg | ORAL_CAPSULE | Freq: Three times a day (TID) | ORAL | 0 refills | Status: DC | PRN

## 2016-09-21 MED ORDER — RANITIDINE HCL 150 MG PO TABS: 150.0000 mg | ORAL_TABLET | Freq: Two times a day (BID) | ORAL | 0 refills | Status: DC

## 2016-09-21 MED ORDER — DOXYCYCLINE HYCLATE 100 MG PO CAPS: 100.0000 mg | ORAL_CAPSULE | Freq: Two times a day (BID) | ORAL | 0 refills | Status: AC

## 2016-09-21 MED ORDER — IPRATROPIUM BROMIDE 0.02 % IN SOLN
0.5000 mg | Freq: Once | RESPIRATORY_TRACT | Status: AC
  Administered 2016-09-21: 0.5 mg via RESPIRATORY_TRACT

## 2016-09-21 MED ORDER — ALBUTEROL SULFATE (2.5 MG/3ML) 0.083% IN NEBU
2.5000 mg | INHALATION_SOLUTION | Freq: Once | RESPIRATORY_TRACT | Status: AC
  Administered 2016-09-21: 2.5 mg via RESPIRATORY_TRACT

## 2016-09-21 MED ORDER — PREDNISONE 20 MG PO TABS: ORAL_TABLET | ORAL | 0 refills | Status: DC

## 2016-09-21 MED ORDER — CETIRIZINE HCL 10 MG PO TABS: 10.0000 mg | ORAL_TABLET | Freq: Every day | ORAL | 2 refills | Status: DC

## 2016-09-21 NOTE — Patient Instructions (Addendum)
This appears to be a lower respiratory infection, however you may be having allergies, and reflux.  We will cover for this as well.  Please start the prednisone tomorrow morning at breakfast. I would like you to start taking zyrtec.  If you feel like after you stop this after 2 weeks your symptoms start again--restart it.  You may have seasonal allergies. Start the zantac as well.  If you feel like after you stop this after 2 weeks your symptoms start again--restart it.  You may have reflux.  Do an overview of reflux diet. Bronchospasm, Adult Bronchospasm is when airways in the lungs get smaller. When this happens, it can be hard to breathe. You may cough. You may also make a whistling sound when you breathe (wheeze). Follow these instructions at home: Medicines   Take over-the-counter and prescription medicines only as told by your doctor.  If you need to use an inhaler or nebulizer to take your medicine, ask your doctor how to use it.  If you were given a spacer, always use it with your inhaler. Lifestyle   Change your heating and air conditioning filter. Do this at least once a month.  Try not to use fireplaces and wood stoves.  Do not  smoke. Do not  allow smoking in your home.  Try not to use things that have a strong smell, like perfume.  Get rid of pests (such as roaches and mice) and their poop.  Remove any mold from your home.  Keep your house clean. Get rid of dust.  Use cleaning products that have no smell.  Replace carpet with wood, tile, or vinyl flooring.  Use allergy-proof pillows, mattress covers, and box spring covers.  Wash bed sheets and blankets every week. Use hot water. Dry them in a dryer.  Use blankets that are made of polyester or cotton.  Wash your hands often.  Keep pets out of your bedroom.  When you exercise, try not to breathe in cold air. General instructions   Have a plan for getting medical care. Know these things:  When to call your  doctor.  When to call local emergency services (911 in the [place]  Where to go in an emergency.  Stay up to date on your shots (immunizations).  When you have an episode:  Stay calm.  Relax.  Breathe slowly. Contact a doctor if:  Your muscles ache.  Your chest hurts.  The color of the mucus you cough up (sputum) changes from clear or white to yellow, green, gray, or bloody.  The mucus you cough up gets thicker.  You have a fever. Get help right away if:  The whistling sound gets worse, even after you take your medicines.  Your coughing gets worse.  You find it even harder to breathe.  Your chest hurts very much. Summary  Bronchospasm is when airways in the lungs get smaller.  When this happens, it can be hard to breathe. You may cough. You may also make a whistling sound when you breathe.  Stay away from things that cause you to have episodes. These include smoke or dust. This information is not intended to replace advice given to you by your health care provider. Make sure you discuss any questions you have with your health care provider. Document Released: 03/12/2009 Document Revised: 05/18/2016 Document Reviewed: 05/18/2016 Elsevier Interactive Patient Education  2017 [employer].  Food Choices for Gastroesophageal Reflux Disease, Adult When you have gastroesophageal reflux disease (GERD), the foods you  eat and your eating habits are very important. Choosing the right foods can help ease your discomfort. What guidelines do I need to follow?  Choose fruits, vegetables, whole grains, and low-fat dairy products.  Choose low-fat meat, fish, and poultry.  Limit fats such as oils, salad dressings, butter, nuts, and avocado.  Keep a food diary. This helps you identify foods that cause symptoms.  Avoid foods that cause symptoms. These may be different for everyone.  Eat small meals often instead of 3 large meals a day.  Eat your meals slowly, in a place  where you are relaxed.  Limit fried foods.  Cook foods using methods other than frying.  Avoid drinking alcohol.  Avoid drinking large amounts of liquids with your meals.  Avoid bending over or lying down until 2-3 hours after eating. What foods are not recommended? These are some foods and drinks that may make your symptoms worse: Vegetables  Tomatoes. Tomato juice. Tomato and spaghetti sauce. Chili peppers. Onion and garlic. Horseradish. Fruits  Oranges, grapefruit, and lemon (fruit and juice). Meats  High-fat meats, fish, and poultry. This includes hot dogs, ribs, ham, sausage, salami, and bacon. Dairy  Whole milk and chocolate milk. Sour cream. Cream. Butter. Ice cream. Cream cheese. Drinks  Coffee and tea. Bubbly (carbonated) drinks or energy drinks. Condiments  Hot sauce. Barbecue sauce. Sweets/Desserts  Chocolate and cocoa. Donuts. Peppermint and spearmint. Fats and Oils  High-fat foods. This includes [place] fries and potato chips. Other  Vinegar. Strong spices. This includes black pepper, white pepper, red pepper, cayenne, curry powder, cloves, ginger, and chili powder. The items listed above may not be a complete list of foods and drinks to avoid. Contact your dietitian for more information.  This information is not intended to replace advice given to you by your health care provider. Make sure you discuss any questions you have with your health care provider. Document Released: 11/14/2011 Document Revised: 10/21/2015 Document Reviewed: 03/19/2013 Elsevier Interactive Patient Education  2017 [employer].    IF you received an x-ray today, you will receive an invoice from [facility]. Please contact [facility] at [phone] with questions or concerns regarding your invoice.   IF you received labwork today, you will receive an invoice from [facility]. Please contact [facility] at [phone] with questions or concerns regarding your invoice.   Our  billing staff will not be able to assist you with questions regarding bills from these companies.  You will be contacted with the lab results as soon as they are available. The fastest way to get your results is to activate your My Chart account. Instructions are located on the last page of this paperwork. If you have not heard from [place] regarding the results in 2 weeks, please contact this office.

## 2016-09-21 NOTE — Progress Notes (Signed)
PRIMARY CARE AT [facility] [address] [place] [address] [phone]  Date:  09/21/2016   Name:  Megan Duarte   DOB:  [birth year]   MRN:  [id number]  PCP:  Doristine Bosworth, MD    History of Present Illness:  Megan Duarte is a 61 y.o. female patient who presents to PCP with  Chief Complaint  Patient presents with  . Cough    x1 month   flonase isn't helping   . Chest Pain    feels tight  . Shortness of Breath  . watery eyes  . Wheezing  . Sore Throat     Patient states that she has had 1 month of coughing with chest discomfort.  She feels as if she is wheezing.  Deep inhalations, cause heavy coughing.  She is coughing up bubbly fluid, and off white.   Watery eyes with watery cough.   No regurgitation, but felt some heartburn this morning and last night.  This was felt at rest.   No hx of asthma.  She has had a hx of bronchitis where she was given an inhaler.   Smoker of 2-3 cigarettes per day at this time. She has hx of CHF  She is using cough drops, and Flonase.   She was prescribed Flonase.   Former PCP dr. Estanislado Spire.   IVC filter in place  Patient Active Problem List   Diagnosis Date Noted  . CKD (chronic kidney disease) stage 3, GFR 30-59 ml/min   . Sialolithiasis of submandibular gland 01/01/2015  . Other specified fever   . Chronic back pain     Past Medical History:  Diagnosis Date  . Back pain   . Blood transfusion without reported diagnosis   . CHF (congestive heart failure) (HCC)   . Diabetes mellitus without complication (HCC)   . DVT (deep venous thrombosis) (HCC)     Past Surgical History:  Procedure Laterality Date  . ABDOMINAL HYSTERECTOMY    . CESAREAN SECTION    . CHOLECYSTECTOMY    . FRACTURE SURGERY     left ankle  . IVC FILTER PLACEMENT ([facility] HX)     for DVT    Social History  Substance Use Topics  . Smoking status: Current Some Day Smoker    Types: E-cigarettes  . Smokeless tobacco: Never Used  . Alcohol use No     Family History  Problem Relation Age of Onset  . Diabetes Father     Allergies  Allergen Reactions  . Bactrim [Sulfamethoxazole-Trimethoprim] Itching    Medication list has been reviewed and updated.  Current Outpatient Prescriptions on File Prior to Visit  Medication Sig Dispense Refill  . cyclobenzaprine (FLEXERIL) 10 MG tablet Take 1 tablet (10 mg total) by mouth 3 (three) times daily as needed for muscle spasms. 30 tablet 0  . furosemide (LASIX) 20 MG tablet Take 20 mg by mouth daily.    Marland Kitchen gabapentin (NEURONTIN) 300 MG capsule Take 300 mg by mouth 2 (two) times daily.    . insulin lispro (HUMALOG) 100 UNIT/ML KiwkPen Inject into the skin.    Marland Kitchen oxyCODONE-acetaminophen (PERCOCET) 10-325 MG per tablet Take 1-2 tablets by mouth every 6 (six) hours as needed for pain.   0  . rivaroxaban (XARELTO) 20 MG TABS tablet Take 20 mg by mouth daily with supper.    . benzonatate (TESSALON) 100 MG capsule Take 2 capsules (200 mg total) by mouth 2 (two) times daily as needed for cough. (Patient not  taking: Reported on 09/21/2016) 20 capsule 0  . lactulose (CHRONULAC) 10 GM/15ML solution Take 10 g (15 mL) up to 3 times daily as needed for constipation. (Patient not taking: Reported on 09/21/2016) 240 mL 0  . metFORMIN (GLUCOPHAGE) 500 MG tablet Take 500 mg by mouth 2 (two) times daily.  0  . senna (SENOKOT) 8.6 MG TABS tablet Take 2 tablets (17.2 mg total) by mouth daily as needed. (Patient not taking: Reported on 09/21/2016) 60 each 0  . warfarin (COUMADIN) 4 MG tablet Take 8 mg by mouth daily.     No current facility-administered medications on file prior to visit.     ROS ROS otherwise unremarkable unless listed above.  Physical Examination: BP 130/81   Pulse 79   Temp 98.5 F (36.9 C) (Oral)   Resp 18   Ht  (1.702 m)   Wt 201 lb (91.2 kg)   SpO2 99%   BMI 31.48 kg/m  Ideal Body Weight: Weight in (lb) to have BMI = 25: 159.3  Physical Exam  Constitutional: She is oriented  to person, place, and time. She appears well-developed and well-nourished. No distress.  HENT:  Head: Normocephalic and atraumatic.  Right Ear: Tympanic membrane, external ear and ear canal normal.  Left Ear: Tympanic membrane, external ear and ear canal normal.  Nose: Mucosal edema and rhinorrhea present. Right sinus exhibits no maxillary sinus tenderness and no frontal sinus tenderness. Left sinus exhibits no maxillary sinus tenderness and no frontal sinus tenderness.  Mouth/Throat: No uvula swelling. No oropharyngeal exudate, posterior oropharyngeal edema or posterior oropharyngeal erythema.  Eyes: Conjunctivae and EOM are normal. Pupils are equal, round, and reactive to light.  Cardiovascular: Normal rate and regular rhythm.  Exam reveals no gallop, no distant heart sounds and no friction rub.   No murmur heard. Pulmonary/Chest: Effort normal. No respiratory distress. She has no decreased breath sounds. She has no wheezes. She has rhonchi.  Lymphadenopathy:       Head (right side): No submandibular, no tonsillar, no preauricular and no posterior auricular adenopathy present.       Head (left side): No submandibular, no tonsillar, no preauricular and no posterior auricular adenopathy present.  Neurological: She is alert and oriented to person, place, and time.  Skin: She is not diaphoretic.  Psychiatric: She has a normal mood and affect. Her behavior is normal.    Dg Chest 2 View  Result Date: 09/21/2016 CLINICAL DATA:  Productive cough for 3 months with shortness of breath. EXAM: CHEST  2 VIEW COMPARISON:  04/26/2016 chest radiograph FINDINGS: The cardiomediastinal silhouette is unremarkable. There is no evidence of focal airspace disease, pulmonary edema, suspicious pulmonary nodule/mass, pleural effusion, or pneumothorax. No acute bony abnormalities are identified. Cholecystectomy clips and IVC filter again noted. IMPRESSION: No active cardiopulmonary disease. Electronically Signed   By:  Harmon Pier M.D.   On: 09/21/2016 12:16      Assessment and Plan: Megan Duarte is a 61 y.o. female who is here today for cc of  Chief Complaint  Patient presents with  . Cough    x1 month   flonase isn't helping   . Chest Pain    feels tight  . Shortness of Breath  . watery eyes  . Wheezing  . Sore Throat  --prednisone taper, h2 blocker, and allergies.  Difficult to narrow down if this is GERD manifestations, along with allergies, or allergies --take medication as advised.  After 2 weeks she will take off the zantac  and zyrtec separately, and if symptoms start she will restart.    Lower respiratory infection (e.g., bronchitis, pneumonia, pneumonitis, pulmonitis) - Plan: cetirizine (ZYRTEC) 10 MG tablet, ranitidine (ZANTAC) 150 MG tablet, albuterol (PROVENTIL HFA;VENTOLIN HFA) 108 (90 Base) MCG/ACT inhaler, predniSONE (DELTASONE) 20 MG tablet, benzonatate (TESSALON) 100 MG capsule  Productive cough - Plan: DG Chest 2 View, POCT CBC, albuterol (PROVENTIL) (2.5 MG/3ML) 0.083% nebulizer solution 2.5 mg, ipratropium (ATROVENT) nebulizer solution 0.5 mg, CANCELED: CBC  Dyspnea, unspecified type - Plan: DG Chest 2 View, POCT CBC, albuterol (PROVENTIL) (2.5 MG/3ML) 0.083% nebulizer solution 2.5 mg, ipratropium (ATROVENT) nebulizer solution 0.5 mg, CANCELED: CBC  Trena Platt, PA-C [facility] 5/1/20188:01 AM

## 2016-10-02 ENCOUNTER — Encounter: Payer: Self-pay | Admitting: Physician Assistant

## 2016-10-02 ENCOUNTER — Ambulatory Visit (INDEPENDENT_AMBULATORY_CARE_PROVIDER_SITE_OTHER): Payer: Medicare Other | Admitting: Physician Assistant

## 2016-10-02 ENCOUNTER — Ambulatory Visit (INDEPENDENT_AMBULATORY_CARE_PROVIDER_SITE_OTHER): Payer: Medicare Other

## 2016-10-02 VITALS — BP 124/74 | HR 107 | Temp 98.1°F | Resp 18 | Ht 67.0 in | Wt 206.8 lb

## 2016-10-02 DIAGNOSIS — J22 Unspecified acute lower respiratory infection: Secondary | ICD-10-CM | POA: Diagnosis not present

## 2016-10-02 DIAGNOSIS — R058 Other specified cough: Secondary | ICD-10-CM

## 2016-10-02 DIAGNOSIS — R0600 Dyspnea, unspecified: Secondary | ICD-10-CM

## 2016-10-02 DIAGNOSIS — R05 Cough: Secondary | ICD-10-CM

## 2016-10-02 MED ORDER — FLUTICASONE PROPIONATE 50 MCG/ACT NA SUSP: 2.0000 | Freq: Every day | NASAL | 1 refills | Status: AC

## 2016-10-02 MED ORDER — HYDROCOD POLST-CPM POLST ER 10-8 MG/5ML PO SUER: 5.0000 mL | Freq: Every evening | ORAL | 0 refills | Status: DC | PRN

## 2016-10-02 MED ORDER — GUAIFENESIN ER 1200 MG PO TB12: 1.0000 | ORAL_TABLET | Freq: Two times a day (BID) | ORAL | 1 refills | Status: DC | PRN

## 2016-10-02 MED ORDER — ALBUTEROL SULFATE HFA 108 (90 BASE) MCG/ACT IN AERS: 2.0000 | INHALATION_SPRAY | Freq: Four times a day (QID) | RESPIRATORY_TRACT | 2 refills | Status: DC | PRN

## 2016-10-02 NOTE — Progress Notes (Signed)
PRIMARY CARE AT [facility] [address], [place] [address] [phone]  Date:  10/02/2016   Name:  Megan Duarte   DOB:  January 06, 1956   MRN:  [id number]  PCP:  Garnetta Buddy, PA    History of Present Illness:  Megan Duarte is a 61 y.o. female patient who presents to PCP with  Chief Complaint  Patient presents with  . Follow-up     RECHECK HER COUGH     2 days ago, she started to have hoarseness and nasal congestion.  She has some front chest discomfort, that eases.  Pain worsens with deep inhalations or when she is coughing.  She has a small frontal headache.  She was having .  She is not using a nasal spray at this time.  She is currently taking the doxycycline and prednisone that was given 11 days ago.  She reports that her symptoms are improving, she just feels hoarse and congestion.  Not using the inhaler as this was too expensive.  She has also not picked up the zantac.  Sleeping with one pillow without elevation.  No lower extremity swelling.  No fever.  Cough is thick and chalky.  No fever.  No burning sensation or nausea at this time   Patient Active Problem List   Diagnosis Date Noted  . CKD (chronic kidney disease) stage 3, GFR 30-59 ml/min   . Sialolithiasis of submandibular gland 01/01/2015  . Other specified fever   . Chronic back pain     Past Medical History:  Diagnosis Date  . Back pain   . Blood transfusion without reported diagnosis   . CHF (congestive heart failure) (HCC)   . Diabetes mellitus without complication (HCC)   . DVT (deep venous thrombosis) (HCC)     Past Surgical History:  Procedure Laterality Date  . ABDOMINAL HYSTERECTOMY    . CESAREAN SECTION    . CHOLECYSTECTOMY    . FRACTURE SURGERY     left ankle  . IVC FILTER PLACEMENT ([facility] HX)     for DVT    Social History  Substance Use Topics  . Smoking status: Current Some Day Smoker    Types: E-cigarettes  . Smokeless tobacco: Never Used  . Alcohol use No    Family  History  Problem Relation Age of Onset  . Diabetes Father     Allergies  Allergen Reactions  . Bactrim [Sulfamethoxazole-Trimethoprim] Itching    Medication list has been reviewed and updated.  Current Outpatient Prescriptions on File Prior to Visit  Medication Sig Dispense Refill  . albuterol (PROVENTIL HFA;VENTOLIN HFA) 108 (90 Base) MCG/ACT inhaler Inhale 2 puffs into the lungs every 4 (four) hours as needed for wheezing or shortness of breath (cough, shortness of breath or wheezing.). 1 Inhaler 1  . benzonatate (TESSALON) 100 MG capsule Take 1-2 capsules (100-200 mg total) by mouth 3 (three) times daily as needed for cough. 40 capsule 0  . cetirizine (ZYRTEC) 10 MG tablet Take 1 tablet (10 mg total) by mouth daily. 30 tablet 2  . cyclobenzaprine (FLEXERIL) 10 MG tablet Take 1 tablet (10 mg total) by mouth 3 (three) times daily as needed for muscle spasms. 30 tablet 0  . furosemide (LASIX) 20 MG tablet Take 20 mg by mouth daily.    Marland Kitchen gabapentin (NEURONTIN) 300 MG capsule Take 300 mg by mouth 2 (two) times daily.    . insulin lispro (HUMALOG) 100 UNIT/ML KiwkPen Inject into the skin.    . metFORMIN (  GLUCOPHAGE) 500 MG tablet Take 500 mg by mouth 2 (two) times daily.  0  . oxyCODONE-acetaminophen (PERCOCET) 10-325 MG per tablet Take 1-2 tablets by mouth every 6 (six) hours as needed for pain.   0  . predniSONE (DELTASONE) 20 MG tablet Take 3 PO QAM x2days, 2 PO QAM x2days, 1 PO QAM x3days 13 tablet 0  . ranitidine (ZANTAC) 150 MG tablet Take 1 tablet (150 mg total) by mouth 2 (two) times daily. 60 tablet 0  . rivaroxaban (XARELTO) 20 MG TABS tablet Take 20 mg by mouth daily with supper.    . lactulose (CHRONULAC) 10 GM/15ML solution Take 10 g (15 mL) up to 3 times daily as needed for constipation. (Patient not taking: Reported on 09/21/2016) 240 mL 0  . senna (SENOKOT) 8.6 MG TABS tablet Take 2 tablets (17.2 mg total) by mouth daily as needed. (Patient not taking: Reported on 09/21/2016) 60  each 0   No current facility-administered medications on file prior to visit.     ROS ROS otherwise unremarkable unless listed above.  Physical Examination: BP 124/74 (BP Location: Right Arm, Patient Position: Sitting, Cuff Size: Large)   Pulse (!) 107   Temp 98.1 F (36.7 C) (Oral)   Resp 18   Ht 5\' 7"  (1.702 m)   Wt 206 lb 12.8 oz (93.8 kg)   SpO2 97%   BMI 32.39 kg/m  Ideal Body Weight: Weight in (lb) to have BMI = 25: 159.3  Physical Exam  Constitutional: She is oriented to person, place, and time. She appears well-developed and well-nourished. No distress.  HENT:  Head: Normocephalic and atraumatic.  Right Ear: Tympanic membrane, external ear and ear canal normal.  Left Ear: Tympanic membrane, external ear and ear canal normal.  Nose: Mucosal edema and rhinorrhea present. Right sinus exhibits no maxillary sinus tenderness and no frontal sinus tenderness. Left sinus exhibits no maxillary sinus tenderness and no frontal sinus tenderness.  Mouth/Throat: No uvula swelling. No oropharyngeal exudate, posterior oropharyngeal edema or posterior oropharyngeal erythema.  Eyes: Conjunctivae and EOM are normal. Pupils are equal, round, and reactive to light.  Cardiovascular: Normal rate and regular rhythm.  Exam reveals no gallop, no distant heart sounds and no friction rub.   No murmur heard. Pulmonary/Chest: Effort normal. No apnea. No respiratory distress. She has no decreased breath sounds. She has no wheezes. She has no rhonchi.  Lymphadenopathy:       Head (right side): No submandibular, no tonsillar, no preauricular and no posterior auricular adenopathy present.       Head (left side): No submandibular, no tonsillar, no preauricular and no posterior auricular adenopathy present.  Neurological: She is alert and oriented to person, place, and time.  Skin: She is not diaphoretic.  Psychiatric: She has a normal mood and affect. Her behavior is normal.     Assessment and  Plan: Megan Duarte is a 61 y.o. female who is here today for cc of follow up of cough and nasal congesiton. Adding cough syrup  Changed inhaler to more cost-effective branding.  Advised to contact if this is too expensive. Advised to pick up.  She never picked up the zantac for possible treatment of gerd.  She will follow up in 1 week if no improvement.  Dyspnea, unspecified type - Plan: DG Chest 2 View, albuterol (PROAIR HFA) 108 (90 Base) MCG/ACT inhaler, fluticasone (FLONASE) 50 MCG/ACT nasal spray  Lower respiratory infection (e.g., bronchitis, pneumonia, pneumonitis, pulmonitis)  Productive cough - Plan: DG Chest  2 View, chlorpheniramine-HYDROcodone (TUSSIONEX PENNKINETIC ER) 10-8 MG/5ML SUER  Trena PlattStephanie Zebastian Carico, PA-C [facility] 5/9/20187:59 AM

## 2016-10-02 NOTE — Patient Instructions (Addendum)
  Please use the cough medicine.  Please taking muc   IF you received an x-ray today, you will receive an invoice from [facility]. Please contact [facility] at [phone] with questions or concerns regarding your invoice.   IF you received labwork today, you will receive an invoice from [facility]. Please contact [facility] at [phone] with questions or concerns regarding your invoice.   Our billing staff will not be able to assist you with questions regarding bills from these companies.  You will be contacted with the lab results as soon as they are available. The fastest way to get your results is to activate your My Chart account. Instructions are located on the last page of this paperwork. If you have not heard from us regarding the results in 2 weeks, please contact this office.

## 2016-10-24 ENCOUNTER — Telehealth: Payer: Self-pay

## 2016-10-24 NOTE — Telephone Encounter (Signed)
Left message to schedule medicare annual wellness visit - nr 

## 2017-01-25 ENCOUNTER — Other Ambulatory Visit: Payer: Self-pay | Admitting: Physician Assistant

## 2017-01-25 DIAGNOSIS — J22 Unspecified acute lower respiratory infection: Secondary | ICD-10-CM

## 2017-01-25 NOTE — Telephone Encounter (Signed)
Rx req for Cetirizine been filled.

## 2017-03-23 ENCOUNTER — Telehealth: Payer: Self-pay

## 2017-03-23 NOTE — Telephone Encounter (Signed)
Called pt to schedule Medicare Annual Wellness Visit. -nr  

## 2017-05-04 ENCOUNTER — Other Ambulatory Visit: Payer: Self-pay | Admitting: Physician Assistant

## 2017-05-04 DIAGNOSIS — J22 Unspecified acute lower respiratory infection: Secondary | ICD-10-CM

## 2017-05-31 ENCOUNTER — Encounter: Payer: Self-pay | Admitting: Cardiology

## 2017-05-31 ENCOUNTER — Ambulatory Visit (INDEPENDENT_AMBULATORY_CARE_PROVIDER_SITE_OTHER): Payer: Medicare Other | Admitting: Cardiology

## 2017-05-31 VITALS — BP 114/70 | HR 88 | Ht 67.0 in | Wt 211.0 lb

## 2017-05-31 DIAGNOSIS — R071 Chest pain on breathing: Secondary | ICD-10-CM

## 2017-05-31 DIAGNOSIS — R079 Chest pain, unspecified: Secondary | ICD-10-CM | POA: Diagnosis not present

## 2017-05-31 DIAGNOSIS — R05 Cough: Secondary | ICD-10-CM | POA: Diagnosis not present

## 2017-05-31 DIAGNOSIS — R059 Cough, unspecified: Secondary | ICD-10-CM

## 2017-05-31 DIAGNOSIS — N183 Chronic kidney disease, stage 3 unspecified: Secondary | ICD-10-CM

## 2017-05-31 NOTE — Patient Instructions (Addendum)
Medication Instructions:  The current medical regimen is effective;  continue present plan and medications.  Labwork: Please have lab work prior to you CT scan to check your kidney function.  Testing/Procedures: Your physician has requested that you have an echocardiogram. Echocardiography is a painless test that uses sound waves to create images of your heart. It provides your doctor with information about the size and shape of your heart and how well your heart's chambers and valves are working. This procedure takes approximately one hour. There are no restrictions for this procedure.  You have been ordered to have a CT of your chest, (CAT scanning), is a noninvasive, special x-ray that produces cross-sectional images of the body using x-rays and a computer. CT scans help physicians diagnose and treat medical conditions. For some CT exams, a contrast material is used to enhance visibility in the area of the body being studied. CT scans provide greater clarity and reveal more details than regular x-ray exams.  Follow-Up: Follow up as needed after the above testing.  Thank you for choosing [facility]!!

## 2017-05-31 NOTE — Progress Notes (Signed)
Cardiology Office Note:    Date:  05/31/2017   ID:  Megan Duarte, DOB 01-30-1956, MRN [id number]  PCP:  Garnetta Buddy, PA  Cardiologist:  Donato Schultz, MD    Referring MD: Sigmund Hazel, MD     History of Present Illness:    Megan Duarte is a 62 y.o. female here for the evaluation of chest pain at the request of Dr. Sigmund Hazel.  Has a prior history of diabetes, DVT with IVC filter placement in [place], 2000. Had many PE. Xarelto now.   She has been experiencing left-sided chest pain sometimes radiating to her back.  She also feels some pain worsenes shortness of breath when taking in a deep breath.  Continuous pain. Last month. Daily. Cough. Chronic. Stopped smoking 2 months ago.   Past Medical History:  Diagnosis Date  . Back pain   . Blood transfusion without reported diagnosis   . CHF (congestive heart failure) (HCC)   . Diabetes mellitus without complication (HCC)   . DVT (deep venous thrombosis) (HCC)     Past Surgical History:  Procedure Laterality Date  . ABDOMINAL HYSTERECTOMY    . CESAREAN SECTION    . CHOLECYSTECTOMY    . FRACTURE SURGERY     left ankle  . IVC FILTER PLACEMENT ([facility] HX)     for DVT    Current Medications: Current Meds  Medication Sig  . albuterol (PROAIR HFA) 108 (90 Base) MCG/ACT inhaler Inhale 2 puffs into the lungs every 6 (six) hours as needed for wheezing or shortness of breath.  . benzonatate (TESSALON) 100 MG capsule Take 1-2 capsules (100-200 mg total) by mouth 3 (three) times daily as needed for cough.  . cetirizine (ZYRTEC) 10 MG tablet TAKE 1 TABLET (10 MG TOTAL) BY MOUTH DAILY.  . chlorpheniramine-HYDROcodone (TUSSIONEX PENNKINETIC ER) 10-8 MG/5ML SUER Take 5 mLs by mouth at bedtime as needed.  . cyclobenzaprine (FLEXERIL) 10 MG tablet Take 1 tablet (10 mg total) by mouth 3 (three) times daily as needed for muscle spasms.  . fluticasone (FLONASE) 50 MCG/ACT nasal spray Place 2 sprays into both nostrils daily.  .  furosemide (LASIX) 20 MG tablet Take 20 mg by mouth daily.  Marland Kitchen gabapentin (NEURONTIN) 300 MG capsule Take 300 mg by mouth 2 (two) times daily.  . Guaifenesin (MUCINEX MAXIMUM STRENGTH) 1200 MG TB12 Take 1 tablet (1,200 mg total) by mouth every 12 (twelve) hours as needed.  . insulin lispro (HUMALOG) 100 UNIT/ML KiwkPen Inject into the skin.  Marland Kitchen lactulose (CHRONULAC) 10 GM/15ML solution Take 10 g (15 mL) up to 3 times daily as needed for constipation.  . metFORMIN (GLUCOPHAGE) 500 MG tablet Take 500 mg by mouth 2 (two) times daily.  Marland Kitchen oxyCODONE-acetaminophen (PERCOCET) 10-325 MG per tablet Take 1-2 tablets by mouth every 6 (six) hours as needed for pain.   . predniSONE (DELTASONE) 20 MG tablet Take 3 PO QAM x2days, 2 PO QAM x2days, 1 PO QAM x3days  . ranitidine (ZANTAC) 150 MG tablet Take 1 tablet (150 mg total) by mouth 2 (two) times daily.  . rivaroxaban (XARELTO) 20 MG TABS tablet Take 20 mg by mouth daily with supper.  . senna (SENOKOT) 8.6 MG TABS tablet Take 2 tablets (17.2 mg total) by mouth daily as needed.     Allergies:   Sulfamethoxazole-trimethoprim   Social History   Socioeconomic History  . Marital status: Single    Spouse name: None  . Number of children: None  . Years of education:  None  . Highest education level: None  Social Needs  . Financial resource strain: None  . Food insecurity - worry: None  . Food insecurity - inability: None  . Transportation needs - medical: None  . Transportation needs - non-medical: None  Occupational History  . None  Tobacco Use  . Smoking status: Current Some Day Smoker    Types: E-cigarettes  . Smokeless tobacco: Never Used  Substance and Sexual Activity  . Alcohol use: No  . Drug use: No  . Sexual activity: None  Other Topics Concern  . None  Social History Narrative  . None     Family History: The patient's  family history includes Diabetes in her father. ROS:   Please see the history of present illness.     All other  systems reviewed and are negative.  EKGs/Labs/Other Studies Reviewed:    The following studies were reviewed today:  April 2018 demonstrated normal stress echo.  EKG:  EKG is  ordered today.  The ekg ordered today demonstrates 05/31/17-sinus rhythm 88 with nonspecific T wave changes.  05/11/17-sinus rhythm with no other abnormalities heart rate 92.  Personally viewed.  Recent Labs: 09/21/2016: Hemoglobin 11.6  Recent Lipid Panel No results found for: CHOL, TRIG, HDL, CHOLHDL, VLDL, LDLCALC, LDLDIRECT   Hemoglobin A1c 8.0, hemoglobin 11.8, platelets 198, white count 5.9, creatinine 1.42 on 11/22/16, LDL 90  Physical Exam:    VS:  Ht 5\' 7"  (1.702 m)   BMI 32.39 kg/m     Wt Readings from Last 3 Encounters:  10/02/16 206 lb 12.8 oz (93.8 kg)  09/21/16 201 lb (91.2 kg)  05/12/16 203 lb (92.1 kg)     GEN:  Well nourished, well developed in no acute distress HEENT: Normal NECK: No JVD; No carotid bruits LYMPHATICS: No lymphadenopathy CARDIAC: RRR, no murmurs, rubs, gallops RESPIRATORY:  Clear to auscultation without rales, wheezing or rhonchi  ABDOMEN: Soft, non-tender, non-distended MUSCULOSKELETAL:  No edema; No deformity  SKIN: Warm and dry NEUROLOGIC:  Alert and oriented x 3 PSYCHIATRIC:  Normal affect   ASSESSMENT:    No diagnosis found. PLAN:    In order of problems listed above:  Atypical Chest pain with underlying diabetes -Her chest pain seems to be pleuritic in origin.  We were able to duplicate her symptoms when she took in a deep breath.  She went slightly over her left chest.  She has been coughing as well over the last several months.  Chest x-ray was done back in May which was unremarkable.  She has been able to stop smoking over the past 2 months, she and her sister both. - Since she is already undergone chest x-rays, I will check a CT scan of her chest with contrast to ensure that there are no worrisome findings given her extensive smoking history and ongoing  discomfort.  I will also check an echocardiogram to ensure proper structure and function of her heart and to ensure that there is no evidence of effusion. -Diabetes is a coronary artery disease equivalent.  Continue to optimize primary prevention.  Prior stress echo was normal back in April 2018.  Clotting disorder/IVC filter -Has seen hematology, continuing with Xarelto 10 mg for life.   Medication Adjustments/Labs and Tests Ordered: Current medicines are reviewed at length with the patient today.  Concerns regarding medicines are outlined above.  No orders of the defined types were placed in this encounter.  No orders of the defined types were placed in this encounter.  Signed, Donato SchultzMark Skains, MD  05/31/2017 11:51 AM    [facility]

## 2017-06-01 ENCOUNTER — Other Ambulatory Visit: Payer: Self-pay | Admitting: *Deleted

## 2017-06-01 ENCOUNTER — Other Ambulatory Visit: Payer: Self-pay

## 2017-06-01 ENCOUNTER — Ambulatory Visit (INDEPENDENT_AMBULATORY_CARE_PROVIDER_SITE_OTHER)
Admission: RE | Admit: 2017-06-01 | Discharge: 2017-06-01 | Disposition: A | Discharge status: Home / Self Care | Payer: Medicare Other | Source: Ambulatory Visit | Attending: Cardiology | Admitting: Cardiology

## 2017-06-01 ENCOUNTER — Ambulatory Visit (HOSPITAL_COMMUNITY): Payer: Medicare Other | Attending: Cardiovascular Disease

## 2017-06-01 DIAGNOSIS — E119 Type 2 diabetes mellitus without complications: Secondary | ICD-10-CM | POA: Diagnosis not present

## 2017-06-01 DIAGNOSIS — F17290 Nicotine dependence, other tobacco product, uncomplicated: Secondary | ICD-10-CM | POA: Insufficient documentation

## 2017-06-01 DIAGNOSIS — I429 Cardiomyopathy, unspecified: Secondary | ICD-10-CM | POA: Diagnosis not present

## 2017-06-01 DIAGNOSIS — R079 Chest pain, unspecified: Secondary | ICD-10-CM | POA: Insufficient documentation

## 2017-06-01 DIAGNOSIS — R05 Cough: Secondary | ICD-10-CM

## 2017-06-01 DIAGNOSIS — Z86711 Personal history of pulmonary embolism: Secondary | ICD-10-CM | POA: Insufficient documentation

## 2017-06-01 DIAGNOSIS — R059 Cough, unspecified: Secondary | ICD-10-CM

## 2017-06-01 DIAGNOSIS — Z86718 Personal history of other venous thrombosis and embolism: Secondary | ICD-10-CM | POA: Insufficient documentation

## 2017-06-01 DIAGNOSIS — I509 Heart failure, unspecified: Secondary | ICD-10-CM | POA: Insufficient documentation

## 2017-06-01 DIAGNOSIS — N183 Chronic kidney disease, stage 3 unspecified: Secondary | ICD-10-CM

## 2017-06-01 DIAGNOSIS — R911 Solitary pulmonary nodule: Secondary | ICD-10-CM

## 2017-06-01 DIAGNOSIS — R071 Chest pain on breathing: Secondary | ICD-10-CM | POA: Diagnosis not present

## 2017-06-01 LAB — BASIC METABOLIC PANEL
BUN/Creatinine Ratio: 10 — ABNORMAL LOW (ref 12–28)
BUN: 13 mg/dL (ref 8–27)
CALCIUM: 9.6 mg/dL (ref 8.7–10.3)
CO2: 23 mmol/L (ref 20–29)
CREATININE: 1.25 mg/dL — AB (ref 0.57–1.00)
Chloride: 106 mmol/L (ref 96–106)
GFR calc Af Amer: 53 mL/min/{1.73_m2} — ABNORMAL LOW (ref >59)
GFR, EST NON AFRICAN AMERICAN: 46 mL/min/{1.73_m2} — AB (ref >59)
Glucose: 95 mg/dL (ref 65–99)
POTASSIUM: 4.5 mmol/L (ref 3.5–5.2)
Sodium: 146 mmol/L — ABNORMAL HIGH (ref 134–144)

## 2017-06-01 MED ORDER — PANTOPRAZOLE SODIUM 40 MG PO TBEC: 40.0000 mg | DELAYED_RELEASE_TABLET | Freq: Every day | ORAL | 11 refills | Status: DC

## 2017-06-01 MED ORDER — IOPAMIDOL (ISOVUE-300) INJECTION 61%
80.0000 mL | Freq: Once | INTRAVENOUS | Status: AC | PRN
  Administered 2017-06-01: 80 mL via INTRAVENOUS

## 2017-06-13 ENCOUNTER — Other Ambulatory Visit: Payer: Self-pay | Admitting: Family Medicine

## 2017-06-13 DIAGNOSIS — Z1231 Encounter for screening mammogram for malignant neoplasm of breast: Secondary | ICD-10-CM

## 2017-06-22 ENCOUNTER — Institutional Professional Consult (permissible substitution): Payer: Medicare Other | Admitting: Pulmonary Disease

## 2017-07-09 ENCOUNTER — Ambulatory Visit (INDEPENDENT_AMBULATORY_CARE_PROVIDER_SITE_OTHER): Payer: Medicare Other | Admitting: Internal Medicine

## 2017-07-09 ENCOUNTER — Encounter: Payer: Self-pay | Admitting: Internal Medicine

## 2017-07-09 VITALS — BP 124/74 | HR 88 | Ht 67.5 in | Wt 210.0 lb

## 2017-07-09 DIAGNOSIS — J45991 Cough variant asthma: Secondary | ICD-10-CM | POA: Diagnosis not present

## 2017-07-09 DIAGNOSIS — R911 Solitary pulmonary nodule: Secondary | ICD-10-CM

## 2017-07-09 LAB — NITRIC OXIDE: Nitric Oxide: 9

## 2017-07-09 NOTE — Assessment & Plan Note (Signed)
CT chest  06/01/17  x 8 mm RUL s/p smoking cessation 02/2017 so rec f/u ct 08/30/17 > placed in reminder file   CT results reviewed with pt >>> Too small for PET or bx, not suspicious enough for excisional bx > really only option for now is follow the Fleischner society guidelines as rec by radiology > 3 months since so temporally related to decision to quit smoking

## 2017-07-09 NOTE — Progress Notes (Signed)
Subjective:     Patient ID: Megan Duarte, female   DOB: 11-Aug-1955,    MRN: [id number]  HPI   84 yobf quit smoking 02/2017 with onset of cough winter of 2018 and worse since quit smoking  assoc with subjective wheeze so referred to pulmonary clinic 07/09/2017 by Dr  Sigmund Hazel     07/09/2017 1st [facility] office visit/ Megan Duarte   Chief Complaint  Patient presents with  . Pulmonary Consult    Referred by Dr. Sigmund Hazel for eval of pulmonary nodule. She states that she has been coughing for at least year. Cough is non prod. Sometimes she coughs until her chest is sore and she gets a HA.   [place] on [place] and [place] dvt > filter/ coumadin > new syncope in [facility]  2016 dx as PE while on coumadin > changed over to xarelto  And has maintained on since. Onset of Cough indolent one year prior to OV , dry and persistent sporadically  24/7  And saba not helping  On protonix 30 min before bast x sev months s change in cough or ":wheeze"    Also using lots of hall's cough drops      Kouffman Reflux v Neurogenic Cough Differentiator Reflux Comments  Do you awaken from a sound sleep coughing violently?                            With trouble breathing? Sometimes no   Do you have choking episodes when you cannot  Get enough air, gasping for air ?              Yes   Do you usually cough when you lie down into  The bed, or when you just lie down to rest ?                          no   Do you usually cough after meals or eating?         sometimes   Do you cough when (or after) you bend over?    no   GERD SCORE     Kouffman Reflux v Neurogenic Cough Differentiator Neurogenic   Do you more-or-less cough all day long? sporadic   Does change of temperature make you cough? no   Does laughing or chuckling cause you to cough? sometimes   Do fumes (perfume, automobile fumes, burned  Toast, etc.,) cause you to cough ?      no   Does speaking, singing, or talking on the phone cause you to cough    ?               singing   Neurogenic/Airway score       Not limited by breathing from desired activities    No obvious other patterns in day to day or daytime variability or assoc excess/ purulent sputum or mucus plugs or hemoptysis or cp or chest tightness,   or overt sinus or hb symptoms. No unusual exposure hx or h/o childhood pna/ asthma or knowledge of premature birth.  Also denies any obvious fluctuation of symptoms with weather or environmental changes or other aggravating or alleviating factors except as outlined above   Current Allergies, Complete Past Medical History, Past Surgical History, Family History, and Social History were reviewed in [employer] record.  ROS  The following are not active complaints unless  bolded Hoarseness, sore throat, dysphagia, dental problems, itching, sneezing,  nasal congestion or discharge of excess mucus or purulent secretions, ear ache,   fever, chills, sweats, unintended wt loss or wt gain, classically pleuritic or exertional cp,  orthopnea pnd or leg swelling, presyncope, palpitations, abdominal pain, anorexia, nausea, vomiting, diarrhea  or change in bowel habits or change in bladder habits, change in stools or change in urine, dysuria, hematuria,  rash, arthralgias, visual complaints, headache, numbness, weakness or ataxia or problems with walking or coordination,  change in mood/affect or memory.        Current Meds  Medication Sig  . albuterol (PROAIR HFA) 108 (90 Base) MCG/ACT inhaler Inhale 2 puffs into the lungs every 6 (six) hours as needed for wheezing or shortness of breath.  Marland Kitchen. atorvastatin (LIPITOR) 10 MG tablet Take 10 mg by mouth daily.  . cetirizine (ZYRTEC) 10 MG tablet TAKE 1 TABLET (10 MG TOTAL) BY MOUTH DAILY.  . cyclobenzaprine (FLEXERIL) 10 MG tablet Take 1 tablet (10 mg total) by mouth 3 (three) times daily as needed for muscle spasms.  . fluticasone (FLONASE) 50 MCG/ACT nasal spray Place 2 sprays into  both nostrils daily.  . furosemide (LASIX) 20 MG tablet Take 20 mg by mouth daily.  . Guaifenesin (MUCINEX MAXIMUM STRENGTH) 1200 MG TB12 Take 1 tablet (1,200 mg total) by mouth every 12 (twelve) hours as needed.  . insulin lispro (HUMALOG) 100 UNIT/ML KiwkPen Inject into the skin.  . metFORMIN (GLUCOPHAGE) 500 MG tablet Take 500 mg by mouth 2 (two) times daily.  Marland Kitchen. oxyCODONE-acetaminophen (PERCOCET) 10-325 MG per tablet Take 1-2 tablets by mouth every 6 (six) hours as needed for pain.   . pantoprazole (PROTONIX) 40 MG tablet Take 1 tablet (40 mg total) by mouth daily.  . rivaroxaban (XARELTO) 20 MG TABS tablet Take 20 mg by mouth daily with supper.           Review of Systems     Objective:   Physical Exam    amb obese bf nad  Wt Readings from Last 3 Encounters:  07/09/17 210 lb (95.3 kg)  05/31/17 211 lb (95.7 kg)  10/02/16 206 lb 12.8 oz (93.8 kg)     Vital signs reviewed - Note on arrival 02 sats  99% on RA     HEENT: nl dentition, turbinates bilaterally, and oropharynx. Nl external ear canals without cough reflex   NECK :  without JVD/Nodes/TM/ nl carotid upstrokes bilaterally   LUNGS: no acc muscle use,  Nl contour chest which is clear to A and P bilaterally without cough on insp or exp maneuvers   CV:  RRR  no s3 or murmur or increase in P2, and no edema   ABD:  Obese soft and nontender with limited inspiratory excursion in the supine position. No bruits or organomegaly appreciated, bowel sounds nl  MS:  Nl gait/ ext warm without deformities, calf tenderness, cyanosis or clubbing No obvious joint restrictions   SKIN: warm and dry without lesions    NEURO:  alert, approp, nl sensorium with  no motor or cerebellar deficits apparent.         I personally reviewed images and agree with radiology impression as follows:  Chest CT 06/01/17 1.  Emphysema (ICD10-J43.9). 2. Esophageal air fluid level suggests dysmotility or gastroesophageal reflux. 3. No  other explanation for cough. 4. 6 mm perifissural right-sided pulmonary nodule. Non-contrast chest CT at 3-6 months is recommended.     Assessment:

## 2017-07-09 NOTE — Assessment & Plan Note (Signed)
FENO 07/09/2017  =   9 Spirometry 07/09/2017  FEV1 1.99 (84%)  Ratio 79 s prior rx  And no curvature  - max rx for gerd (has abn esophagus on CT chest 06/11/17)  Based on initial w/u and reported neg response to saba this is not likely cough variant asthma but more likely Upper airway cough syndrome (previously labeled PNDS),  is so named because it's frequently impossible to sort out how much is  CR/sinusitis with freq throat clearing (which can be related to primary GERD)   vs  causing  secondary (" extra esophageal")  GERD from wide swings in gastric pressure that occur with throat clearing, often  promoting self use of mint and menthol lozenges that reduce the lower esophageal sphincter tone and exacerbate the problem further in a cyclical fashion.   These are the same pts (now being labeled as having "irritable larynx syndrome" by some cough centers) who not infrequently have a history of having failed to tolerate ace inhibitors,  dry powder inhalers or biphosphonates or report having atypical/extraesophageal reflux symptoms that don't respond to standard doses of PPI  and are easily confused as having aecopd or asthma flares by even experienced allergists/ pulmonologists (myself included).   rec start with max rx for gerd x 6 weeks then regroup, consider allergy w/u and MCT before trial of gabapentin in that order.    Reviewed with pt The standardized cough guidelines published in Chest by [facility] in 2006 are still the best available and consist of a multiple step process (up to 12!) , not a single office visit,  and are intended  to address this problem logically,  with an alogrithm dependent on response to empiric treatment at  each progressive step  to determine a specific diagnosis with  minimal addtional testing needed. Therefore if adherence is an issue or can't be accurately verified,  it's very unlikely the standard evaluation and treatment will be successful here.    Furthermore,  response to therapy (other than acute cough suppression, which should only be used short term with avoidance of narcotic containing cough syrups if possible), can be a gradual process for which the patient is not likely to  perceive immediate benefit.  Unlike going to an eye doctor where the best perscription is almost always the first one and is immediately effective, this is almost never the case in the management of chronic cough syndromes. Therefore the patient needs to commit up front to consistently adhere to recommendations  for up to 6 weeks of therapy directed at the likely underlying problem(s) before the response can be reasonably evaluated.    Total time devoted to counseling  > 50 % of initial 60 min office visit:  review case with pt/ discussion of options/alternatives/ personally creating written customized instructions  in presence of pt  then going over those specific  Instructions directly with the pt including how to use all of the meds but in particular covering each new medication in detail and the difference between the maintenance= "automatic" meds and the prns using an action plan format for the latter (If this problem/symptom => do that organization reading Left to right).  Please see AVS from this visit for a full list of these instructions which I personally wrote for this pt and  are unique to this visit.

## 2017-07-09 NOTE — Patient Instructions (Addendum)
Continue Pantoprazole (protonix) 40 mg   Take  30-60 min before first meal of the day and Pepcid (famotidine)  20 mg one after supper until return to office - this is the best way to tell whether stomach acid is contributing to your problem.     GERD (REFLUX)  is an extremely common cause of respiratory symptoms just like yours , many times with no obvious heartburn at all.    It can be treated with medication, but also with lifestyle changes including elevation of the head of your bed (ideally with 6 inch  bed blocks),  Smoking cessation, avoidance of late meals, excessive alcohol, and avoid fatty foods, chocolate, peppermint, colas, red wine, and acidic juices such as orange juice.  NO MINT OR MENTHOL PRODUCTS SO NO COUGH DROPS   USE SUGARLESS CANDY INSTEAD (Jolley ranchers or Stover's or Life Savers) or even ice chips will also do - the key is to swallow to prevent all throat clearing. NO OIL BASED VITAMINS - use powdered substitutes.    Please schedule a follow up office visit in 6 weeks, call sooner if needed with all medications /inhalers/ solutions in hand so we can verify exactly what you are taking. This includes all medications from all doctors and over the counters

## 2017-07-12 ENCOUNTER — Ambulatory Visit (INDEPENDENT_AMBULATORY_CARE_PROVIDER_SITE_OTHER): Payer: Medicare Other | Admitting: Neurology

## 2017-07-12 ENCOUNTER — Encounter: Payer: Self-pay | Admitting: Neurology

## 2017-07-12 VITALS — BP 119/69 | HR 89 | Ht 67.5 in | Wt 213.5 lb

## 2017-07-12 DIAGNOSIS — M542 Cervicalgia: Secondary | ICD-10-CM

## 2017-07-12 DIAGNOSIS — G8929 Other chronic pain: Secondary | ICD-10-CM | POA: Diagnosis not present

## 2017-07-12 DIAGNOSIS — M545 Other chronic pain: Secondary | ICD-10-CM | POA: Insufficient documentation
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
## 2017-07-30 ENCOUNTER — Telehealth: Payer: Self-pay | Admitting: *Deleted

## 2017-07-30 DIAGNOSIS — R911 Solitary pulmonary nodule: Secondary | ICD-10-CM

## 2017-07-30 NOTE — Telephone Encounter (Signed)
Spoke with the pt and reminded her about the ct chest needing to be done 08/30/17  She verbalized understanding and order was [facility]

## 2017-07-30 NOTE — Telephone Encounter (Signed)
-----   Message from Nyoka CowdenMichael B Wert, MD sent at 07/09/2017  7:34 PM EST ----- Ct chest s contrast f/u spn

## 2017-08-07 ENCOUNTER — Telehealth: Payer: Self-pay | Admitting: Neurology

## 2017-08-07 NOTE — Telephone Encounter (Signed)
Pt called she is having to relocate to [place] due to a death in the family.  FYI

## 2017-08-08 ENCOUNTER — Encounter: Payer: Medicare Other | Admitting: Neurology

## 2017-08-10 ENCOUNTER — Emergency Department (HOSPITAL_COMMUNITY)
Admission: EM | Admit: 2017-08-10 | Discharge: 2017-08-11 | Disposition: A | Discharge status: Home / Self Care | Payer: Medicare Other | Attending: Emergency Medicine | Admitting: Emergency Medicine

## 2017-08-10 ENCOUNTER — Encounter (HOSPITAL_COMMUNITY): Payer: Self-pay | Admitting: Emergency Medicine

## 2017-08-10 ENCOUNTER — Emergency Department (HOSPITAL_COMMUNITY): Payer: Medicare Other

## 2017-08-10 DIAGNOSIS — K529 Noninfective gastroenteritis and colitis, unspecified: Secondary | ICD-10-CM

## 2017-08-10 DIAGNOSIS — E119 Type 2 diabetes mellitus without complications: Secondary | ICD-10-CM | POA: Diagnosis not present

## 2017-08-10 DIAGNOSIS — Z7901 Long term (current) use of anticoagulants: Secondary | ICD-10-CM | POA: Diagnosis not present

## 2017-08-10 DIAGNOSIS — Z794 Long term (current) use of insulin: Secondary | ICD-10-CM | POA: Insufficient documentation

## 2017-08-10 DIAGNOSIS — N3000 Acute cystitis without hematuria: Secondary | ICD-10-CM | POA: Diagnosis not present

## 2017-08-10 DIAGNOSIS — Z87891 Personal history of nicotine dependence: Secondary | ICD-10-CM | POA: Insufficient documentation

## 2017-08-10 DIAGNOSIS — R112 Nausea with vomiting, unspecified: Secondary | ICD-10-CM | POA: Diagnosis present

## 2017-08-10 DIAGNOSIS — N183 Chronic kidney disease, stage 3 (moderate): Secondary | ICD-10-CM | POA: Insufficient documentation

## 2017-08-10 DIAGNOSIS — Z79899 Other long term (current) drug therapy: Secondary | ICD-10-CM | POA: Diagnosis not present

## 2017-08-10 LAB — URINALYSIS, ROUTINE W REFLEX MICROSCOPIC
Bilirubin Urine: NEGATIVE
Glucose, UA: NEGATIVE mg/dL
Ketones, ur: 5 mg/dL — AB
Nitrite: POSITIVE — AB
Protein, ur: 100 mg/dL — AB
RBC / HPF: NONE SEEN RBC/hpf (ref 0–5)
SPECIFIC GRAVITY, URINE: 1.019 (ref 1.005–1.030)
pH: 5 (ref 5.0–8.0)

## 2017-08-10 LAB — COMPREHENSIVE METABOLIC PANEL
ALK PHOS: 159 U/L — AB (ref 38–126)
ALT: 17 U/L (ref 14–54)
AST: 17 U/L (ref 15–41)
Albumin: 4.1 g/dL (ref 3.5–5.0)
Anion gap: 11 (ref 5–15)
BUN: 16 mg/dL (ref 6–20)
CALCIUM: 9.4 mg/dL (ref 8.9–10.3)
CO2: 26 mmol/L (ref 22–32)
CREATININE: 1.84 mg/dL — AB (ref 0.44–1.00)
Chloride: 106 mmol/L (ref 101–111)
GFR calc Af Amer: 33 mL/min — ABNORMAL LOW (ref >60)
GFR calc non Af Amer: 28 mL/min — ABNORMAL LOW (ref >60)
Glucose, Bld: 92 mg/dL (ref 65–99)
Potassium: 3.3 mmol/L — ABNORMAL LOW (ref 3.5–5.1)
SODIUM: 143 mmol/L (ref 135–145)
Total Bilirubin: 0.3 mg/dL (ref 0.3–1.2)
Total Protein: 7.1 g/dL (ref 6.5–8.1)

## 2017-08-10 LAB — CBC
HCT: 36.9 % (ref 36.0–46.0)
Hemoglobin: 11.8 g/dL — ABNORMAL LOW (ref 12.0–15.0)
MCH: 29.6 pg (ref 26.0–34.0)
MCHC: 32 g/dL (ref 30.0–36.0)
MCV: 92.7 fL (ref 78.0–100.0)
Platelets: 220 10*3/uL (ref 150–400)
RBC: 3.98 MIL/uL (ref 3.87–5.11)
RDW: 17.1 % — ABNORMAL HIGH (ref 11.5–15.5)
WBC: 8.9 10*3/uL (ref 4.0–10.5)

## 2017-08-10 LAB — LIPASE, BLOOD: Lipase: 34 U/L (ref 11–51)

## 2017-08-10 MED ORDER — SODIUM CHLORIDE 0.9 % IJ SOLN
INTRAMUSCULAR | Status: AC
  Filled 2017-08-10: qty 50

## 2017-08-10 MED ORDER — SODIUM CHLORIDE 0.9 % IV BOLUS (SEPSIS)
1000.0000 mL | Freq: Once | INTRAVENOUS | Status: AC
  Administered 2017-08-10: 1000 mL via INTRAVENOUS

## 2017-08-10 MED ORDER — CEPHALEXIN 500 MG PO CAPS: 500.0000 mg | ORAL_CAPSULE | Freq: Four times a day (QID) | ORAL | 0 refills | Status: AC

## 2017-08-10 MED ORDER — SODIUM CHLORIDE 0.9 % IV SOLN
INTRAVENOUS | Status: DC
  Administered 2017-08-10: 22:00:00 via INTRAVENOUS

## 2017-08-10 MED ORDER — SODIUM CHLORIDE 0.9 % IV SOLN
1.0000 g | Freq: Once | INTRAVENOUS | Status: AC
  Administered 2017-08-10: 1 g via INTRAVENOUS
  Filled 2017-08-10: qty 10

## 2017-08-10 MED ORDER — LOPERAMIDE HCL 2 MG PO TABS: 2.0000 mg | ORAL_TABLET | Freq: Four times a day (QID) | ORAL | 0 refills | Status: AC | PRN

## 2017-08-10 MED ORDER — ONDANSETRON HCL 4 MG/2ML IJ SOLN
4.0000 mg | Freq: Once | INTRAMUSCULAR | Status: AC
  Administered 2017-08-10: 4 mg via INTRAVENOUS
  Filled 2017-08-10: qty 2

## 2017-08-10 MED ORDER — IOPAMIDOL (ISOVUE-300) INJECTION 61%
INTRAVENOUS | Status: AC
  Administered 2017-08-10: 80 mL
  Filled 2017-08-10: qty 100

## 2017-08-10 MED ORDER — ONDANSETRON 4 MG PO TBDP: 4.0000 mg | ORAL_TABLET | Freq: Three times a day (TID) | ORAL | 1 refills | Status: AC | PRN

## 2017-08-10 NOTE — ED Provider Notes (Signed)
[facility]-EMERGENCY DEPT Provider Note   CSN: [id number] Arrival date & time: 08/10/17  1401     History   Chief Complaint Chief Complaint  Patient presents with  . Emesis  . Diarrhea    HPI Megan Duarte is a 62 y.o. female.  Patient here with  her sister both of them ate the same meal at a restaurant both got sick starting on Tuesday evening with nausea vomiting and diarrhea multiple episodes of diarrhea and vomiting over the first few days.  Having less now.  Today only had one episode of vomiting and one episode of diarrhea.  No frank blood there was some streaks of blood in the bowel movements.  No known sick exposures.  No documented fever.  Patient is on Xarelto.      Past Medical History:  Diagnosis Date  . Back pain   . Blood transfusion without reported diagnosis   . Diabetes mellitus without complication (HCC)   . DVT (deep venous thrombosis) [facility])     Patient Active Problem List   Diagnosis Date Noted  . Chronic neck pain 07/12/2017  . Chronic low back pain 07/12/2017  . 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 Solitary pulmonary nodule on lung CT 07/09/2017  . CKD (chronic kidney disease) stage 3, GFR 30-59 ml/min (HCC)   . Sialolithiasis of submandibular gland 01/01/2015  . Other specified fever   . Chronic back pain     Past Surgical History:  Procedure Laterality Date  . ABDOMINAL HYSTERECTOMY    . CESAREAN SECTION     x 3  . CHOLECYSTECTOMY    . FRACTURE SURGERY     left ankle  . IVC FILTER PLACEMENT ([facility] HX)     for DVT    OB History    No data available       Home Medications    Prior to Admission medications   Medication Sig Start Date End Date Taking? Authorizing Provider  albuterol (VENTOLIN HFA) 108 (90 Base) MCG/ACT inhaler Ventolin HFA 90 mcg/actuation aerosol inhaler   Yes [provider]  atorvastatin (LIPITOR) 10 MG tablet Take 10 mg by mouth daily.   Yes [provider]    cetirizine (ZYRTEC) 10 MG tablet TAKE 1 TABLET (10 MG TOTAL) BY MOUTH DAILY. 05/06/17  Yes English, Judeth Cornfield D, PA  cyclobenzaprine (FLEXERIL) 10 MG tablet Take 1 tablet (10 mg total) by mouth 3 (three) times daily as needed for muscle spasms. 05/12/16  Yes English, Stephanie D, PA  fluticasone (FLONASE) 50 MCG/ACT nasal spray Place 2 sprays into both nostrils daily. 10/02/16  Yes English, Judeth Cornfield D, PA  furosemide (LASIX) 20 MG tablet Take 20 mg by mouth daily.   Yes [provider]  gabapentin (NEURONTIN) 300 MG capsule Take 900 mg by mouth at bedtime.   Yes [provider]  HUMALOG MIX 75/25 KWIKPEN (75-25) 100 UNIT/ML Kwikpen INJECT 40 UNITS SUBCUTANEOSULY IN THE MORNING AND 40 UNITS IN THE EVENING 07/25/17  Yes [provider]  linagliptin (TRADJENTA) 5 MG TABS tablet Take 5 mg by mouth daily.   Yes [provider]  lisinopril (PRINIVIL,ZESTRIL) 10 MG tablet Take 10 mg by mouth daily.   Yes [provider]  metFORMIN (GLUCOPHAGE) 500 MG tablet Take 500 mg by mouth 2 (two) times daily. 10/27/14  Yes [provider]  Multiple Vitamins-Minerals (HAIR SKIN AND NAILS FORMULA PO) Take 1 tablet by mouth daily.   Yes [provider]  oxyCODONE-acetaminophen (  PERCOCET) 10-325 MG per tablet Take 1-2 tablets by mouth every 6 (six) hours as needed for pain.  12/02/14  Yes [provider]  pantoprazole (PROTONIX) 40 MG tablet Take 40 mg by mouth daily. 07/27/17  Yes [provider]  phentermine (ADIPEX-P) 37.5 MG tablet Take 37.5 mg by mouth daily. 07/16/17  Yes [provider]  Polyethyl Glycol-Propyl Glycol (SYSTANE OP) Apply 2 drops to eye daily as needed (DRY EYE).   Yes [provider]  rivaroxaban (XARELTO) 10 MG TABS tablet Take 20 mg by mouth daily with supper.   Yes [provider]  cephALEXin (KEFLEX) 500 MG capsule Take 1 capsule (500 mg total) by mouth 4 (four) times daily. 08/10/17   Vanetta Mulders, MD  loperamide (IMODIUM A-D) 2 MG tablet Take 1 tablet (2 mg total) by mouth 4 (four) times daily as needed for diarrhea or loose stools. 08/10/17   Vanetta Mulders, MD  ondansetron (ZOFRAN ODT) 4 MG disintegrating tablet Take 1 tablet (4 mg total) by mouth every 8 (eight) hours as needed for nausea or vomiting. 08/10/17   Vanetta Mulders, MD    Family History Family History  Problem Relation Age of Onset  . Hypertension Mother   . Diabetes Father     Social History Social History   Tobacco Use  . Smoking status: Former Smoker    Packs/day: 0.25    Years: 22.00    Pack years: 5.50    Types: Cigarettes    Last attempt to quit: 02/26/2017    Years since quitting: 0.4  . Smokeless tobacco: Never Used  Substance Use Topics  . Alcohol use: Yes    Comment: wine few times per year  . Drug use: No     Allergies   Sulfamethoxazole-trimethoprim   Review of Systems Review of Systems  Constitutional: Negative for fever.  HENT: Negative for congestion.   Eyes: Negative for redness.  Respiratory: Negative for shortness of breath.   Cardiovascular: Negative for chest pain.  Gastrointestinal: Positive for abdominal pain, diarrhea, nausea and vomiting.  Genitourinary: Negative for dysuria.  Musculoskeletal: Negative for back pain.  Skin: Negative for rash.  Neurological: Negative for headaches.  Hematological: Bruises/bleeds easily.  Psychiatric/Behavioral: Negative for confusion.     Physical Exam Updated Vital Signs BP (!) 111/55   Pulse 80   Temp 98.2 F (36.8 C) (Oral)   Resp 16   Ht 1.702 m (5\' 7" )   Wt 95.7 kg (211 lb)   SpO2 98%   BMI 33.05 kg/m   Physical Exam  Constitutional: She is oriented to person, place, and time. She appears well-developed and well-nourished. No distress.  HENT:  Head: Normocephalic and atraumatic.  Mucous membranes slightly dry.  Eyes: Conjunctivae and EOM are normal. Pupils are equal, round, and reactive to light.  Neck:  Normal range of motion. Neck supple.  Cardiovascular: Normal rate, regular rhythm and normal heart sounds.  Pulmonary/Chest: Effort normal and breath sounds normal.  Abdominal: Soft. Bowel sounds are normal.  Neurological: She is alert and oriented to person, place, and time. No cranial nerve deficit. She exhibits normal muscle tone. Coordination normal.  Skin: Skin is warm.  Nursing note and vitals reviewed.    ED Treatments / Results  Labs (all labs ordered are listed, but only abnormal results are displayed) Labs Reviewed  COMPREHENSIVE METABOLIC PANEL - Abnormal; Notable for the following components:      Result Value   Potassium 3.3 (*)    Creatinine,  Ser 1.84 (*)    Alkaline Phosphatase 159 (*)    GFR calc non Af Amer 28 (*)    GFR calc Af Amer 33 (*)    All other components within normal limits  CBC - Abnormal; Notable for the following components:   Hemoglobin 11.8 (*)    RDW 17.1 (*)    All other components within normal limits  URINALYSIS, ROUTINE W REFLEX MICROSCOPIC - Abnormal; Notable for the following components:   APPearance CLOUDY (*)    Hgb urine dipstick MODERATE (*)    Ketones, ur 5 (*)    Protein, ur 100 (*)    Nitrite POSITIVE (*)    Leukocytes, UA MODERATE (*)    Bacteria, UA MANY (*)    Squamous Epithelial / LPF 0-5 (*)    All other components within normal limits  URINE CULTURE  LIPASE, BLOOD    EKG  EKG Interpretation None       Radiology Ct Abdomen Pelvis W Contrast  Result Date: 08/10/2017 CLINICAL DATA:  Initial evaluation for acute abdominal pain with nausea, vomiting, and diarrhea for 3 days. EXAM: CT ABDOMEN AND PELVIS WITH CONTRAST TECHNIQUE: Multidetector CT imaging of the abdomen and pelvis was performed using the standard protocol following bolus administration of intravenous contrast. CONTRAST:  80mL ISOVUE-300 IOPAMIDOL (ISOVUE-300) INJECTION 61% COMPARISON:  Prior radiograph from 04/26/2016. FINDINGS: Lower chest: Mild scattered  bibasilar atelectatic changes. Visualized lung bases are otherwise clear. Hepatobiliary: Liver demonstrates a normal contrast enhanced appearance. Gallbladder surgically absent. No biliary dilatation. Pancreas: Pancreas within normal limits. Spleen: Spleen within normal limits. Adrenals/Urinary Tract: Adrenal glands are normal. Kidneys equal size with symmetric enhancement. Few small cysts noted within left kidney. No nephrolithiasis, hydronephrosis, or focal enhancing renal mass. No hydroureter. Bladder largely decompressed without acute abnormality. Mild circumferential bladder wall thickening likely related incomplete distension. Stomach/Bowel: Small hiatal hernia. Stomach otherwise unremarkable. No evidence for bowel obstruction. Appendix is normal. Colonic diverticulosis without evidence for acute diverticulitis. No acute inflammatory changes seen about the bowels. Vascular/Lymphatic: Mild aorto bi-iliac atherosclerotic disease. No aneurysm. Normal intravascular enhancement seen throughout the intra-abdominal aorta. Mesenteric vessels patent proximally. No adenopathy. IVC filter in place. Reproductive: Uterus is absent. Left ovary within normal limits. Right ovary not discretely identified. Other: No free air or fluid. Postsurgical changes noted at the ventral abdominal wall. Musculoskeletal: No acute osseous abnormality. No worrisome lytic or blastic osseous lesions. Advanced degenerative changes noted throughout the lumbar spine. IMPRESSION: 1. No acute intra-abdominal or pelvic process identified. 2. Mild diffuse circumferential bladder wall thickening, most like related incomplete distension. Correlation with urinalysis recommended as acute cystitis could also have this appearance. 3. Colonic diverticulosis without evidence for acute diverticulitis. 4. Status post cholecystectomy and hysterectomy. 5. IVC filter in place. Electronically Signed   By: Rise MuBenjamin  McClintock M.D.   On: 08/10/2017 22:21     Procedures Procedures (including critical care time)  Medications Ordered in ED Medications  0.9 %  sodium chloride infusion ( Intravenous New Bag/Given 08/10/17 2136)  sodium chloride 0.9 % injection (not administered)  cefTRIAXone (ROCEPHIN) 1 g in sodium chloride 0.9 % 100 mL IVPB (1 g Intravenous New Bag/Given 08/10/17 2325)  sodium chloride 0.9 % bolus 1,000 mL (1,000 mLs Intravenous New Bag/Given 08/10/17 2135)  ondansetron (ZOFRAN) injection 4 mg (4 mg Intravenous Given 08/10/17 2135)  iopamidol (ISOVUE-300) 61 % injection (80 mLs  Contrast Given 08/10/17 2149)     Initial Impression / Assessment and Plan / ED Course  I have  reviewed the triage vital signs and the nursing notes.  Pertinent labs & imaging results that were available during my care of the patient were reviewed by me and considered in my medical decision making (see chart for details).    Symptoms could be consistent with food poisoning.  Also could be consistent with a viral gastroenteritis.  CT scan without any acute findings.  Labs without significant abnormalities.  Patient received 1 L normal saline IV hydration and maintenance fluids here.  Urinalysis is consistent with urinary tract infection it is positive nitrite.  Patient received 1 g Rocephin here for that will be continued on Keflex.  Along with Zofran and Imodium.  Patient overall feeling better.  Nontoxic no acute distress.  Does have primary care provider to follow-up with.   Final Clinical Impressions(s) / ED Diagnoses   Final diagnoses:  Gastroenteritis  Acute cystitis without hematuria    ED Discharge Orders        Ordered    cephALEXin (KEFLEX) 500 MG capsule  4 times daily     08/10/17 2345    ondansetron (ZOFRAN ODT) 4 MG disintegrating tablet  Every 8 hours PRN     08/10/17 2345    loperamide (IMODIUM A-D) 2 MG tablet  4 times daily PRN     08/10/17 2345       Vanetta Mulders, MD 08/10/17 2353

## 2017-08-10 NOTE — ED Triage Notes (Signed)
Patient c/o abdominal pain and N/V/D x3 days. Reports family member have similar sx. States "I think it may be food poisoning."

## 2017-08-10 NOTE — Discharge Instructions (Signed)
Take the Keflex as directed for the urinary tract infection.  Urine has culture has been sent as well for confirmation.  Take Zofran for nausea and vomiting take the Imodium for diarrhea.  Make an appointment follow-up with your doctor.  Return here for any new or worse symptoms.  Today's workup without any significant findings other than the urinary tract infection concern.

## 2017-08-10 NOTE — ED Notes (Signed)
Bed: WA21 Expected date:  Expected time:  Means of arrival:  Comments: Hold for room 8

## 2017-08-11 NOTE — ED Notes (Signed)
Sample in lab 

## 2017-08-13 LAB — URINE CULTURE: Culture: >=100000 — AB

## 2017-08-14 ENCOUNTER — Telehealth: Payer: Self-pay | Admitting: Emergency Medicine

## 2017-08-14 NOTE — Telephone Encounter (Signed)
Post ED Visit - Positive Culture Follow-up  Culture report reviewed by antimicrobial stewardship pharmacist:  []  Enzo BiNathan Batchelder, Pharm.D. []  Celedonio MiyamotoJeremy Frens, Pharm.D., BCPS AQ-ID []  Garvin FilaMike Maccia, Pharm.D., BCPS [x]  Georgina PillionElizabeth Martin, Pharm.D., BCPS []  ArnoldMinh Pham, [address] BoulevardPharm.D., BCPS, AAHIVP []  Estella HuskMichelle Turner, Pharm.D., BCPS, AAHIVP []  Lysle Pearlachel Rumbarger, PharmD, BCPS []  Blake DivineShannon Parkey, PharmD []  Pollyann SamplesAndy Johnston, PharmD, BCPS  Positive urine culture Treated with cephalexin, organism sensitive to the same and no further patient follow-up is required at this time.  Berle MullMiller, Baron Parmelee 08/14/2017, 2:44 PM

## 2017-08-21 ENCOUNTER — Ambulatory Visit: Payer: Medicare Other | Admitting: Internal Medicine

## 2017-08-28 ENCOUNTER — Encounter: Payer: Self-pay | Admitting: Physician Assistant

## 2017-08-30 ENCOUNTER — Inpatient Hospital Stay: Admission: RE | Admit: 2017-08-30 | Payer: Medicare Other | Source: Ambulatory Visit

## 2017-08-31 ENCOUNTER — Ambulatory Visit: Payer: Medicare Other | Admitting: Internal Medicine

## 2017-09-11 ENCOUNTER — Other Ambulatory Visit: Payer: Self-pay | Admitting: Physician Assistant

## 2017-09-11 DIAGNOSIS — J22 Unspecified acute lower respiratory infection: Secondary | ICD-10-CM

## 2017-09-19 ENCOUNTER — Ambulatory Visit: Payer: Medicare Other

## 2018-01-20 IMAGING — DX DG CHEST 2V
2 series · 2 of 2 positions shown · non-contrast
Comparison: 09/21/2016

CLINICAL DATA: Dyspnea and cough

EXAM:
CHEST  2 VIEW

[chest pa]
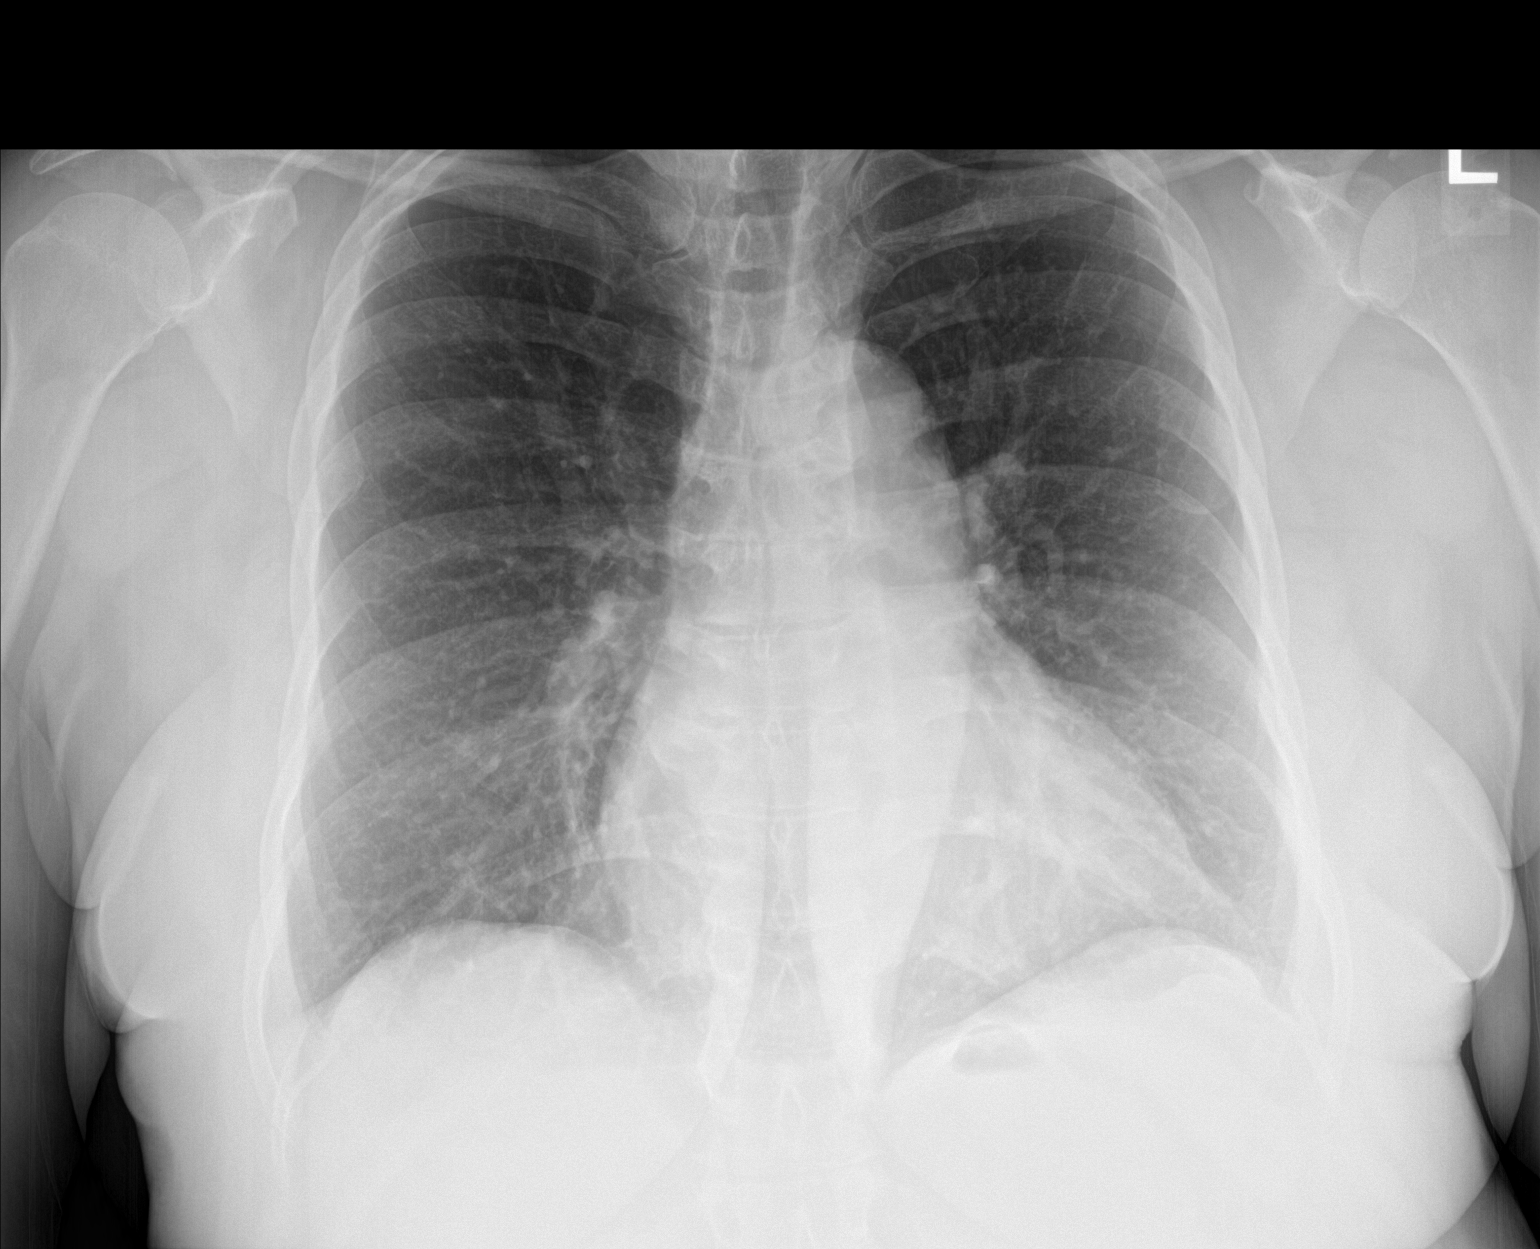

[chest lat]
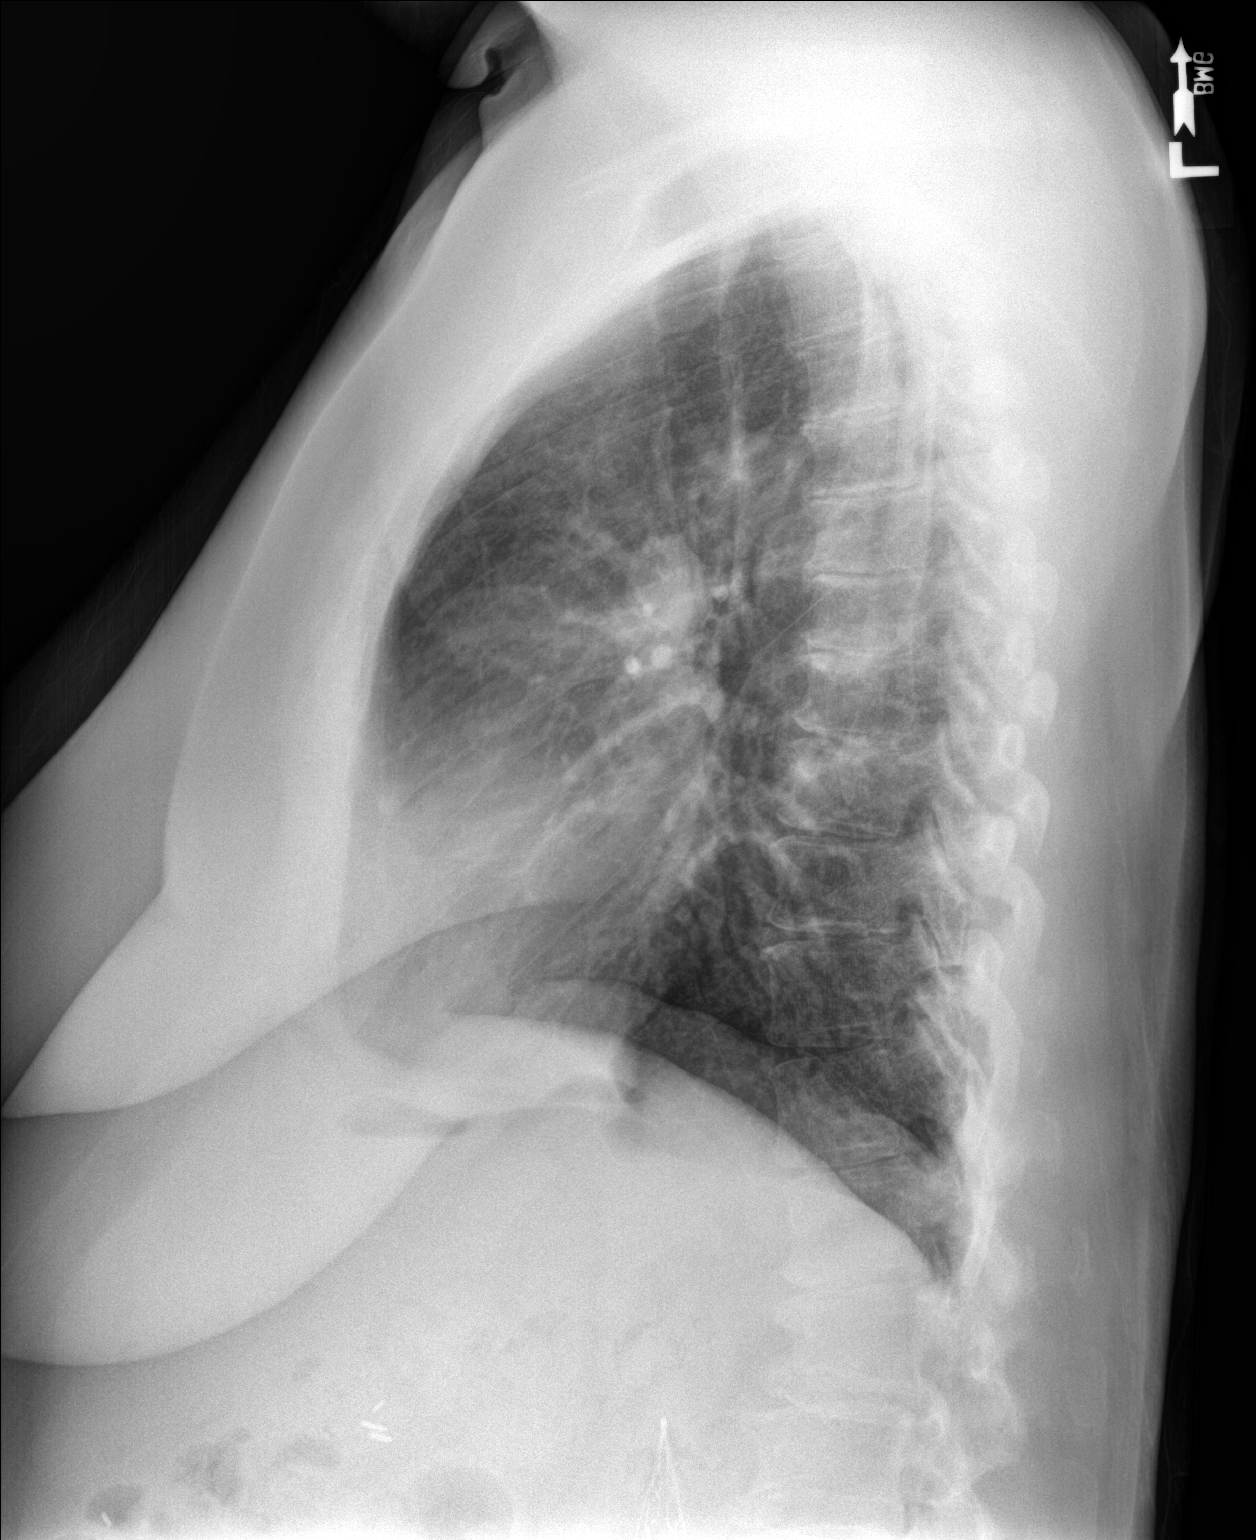

[2 of 2 positions shown; findings below may reference images not displayed]

FINDINGS: The heart size and mediastinal contours are within normal limits.
Both lungs are clear. The visualized skeletal structures are
unremarkable.
IMPRESSION: No active cardiopulmonary disease.

## 2018-11-08 ENCOUNTER — Other Ambulatory Visit: Payer: Self-pay | Admitting: Cardiology

## 2018-11-28 IMAGING — CT CT ABD-PELV W/ CM
2 of 5 series · 16 of 46 positions shown, 18 images · IV contrast (ISOVUE)
Comparison: Prior radiograph from 04/26/2016.

CLINICAL DATA: Initial evaluation for acute abdominal pain with
nausea, vomiting, and diarrhea for 3 days.

EXAM:
CT ABDOMEN AND PELVIS WITH CONTRAST
TECHNIQUE: Multidetector CT imaging of the abdomen and pelvis was performed
using the standard protocol following bolus administration of
intravenous contrast.
CONTRAST:  80mL QUKE2H-XTT IOPAMIDOL (QUKE2H-XTT) INJECTION 61%

[Series 2: axial st · axial · 0.85mm/px · z∈[+1073,+1448]mm · 13 of 85 slices shown, 15 images]
[im 5/85  soft-tissue]
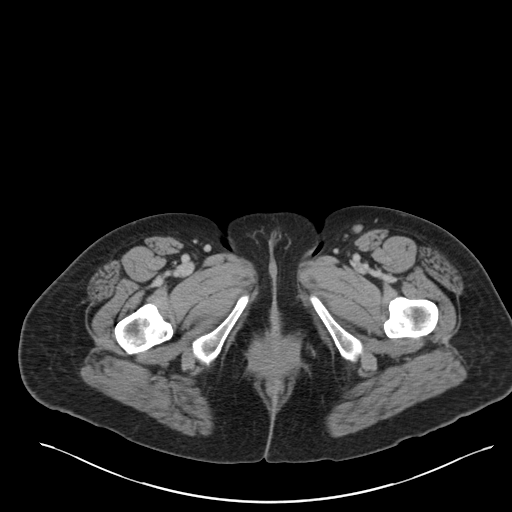
[im 5/85  bone]
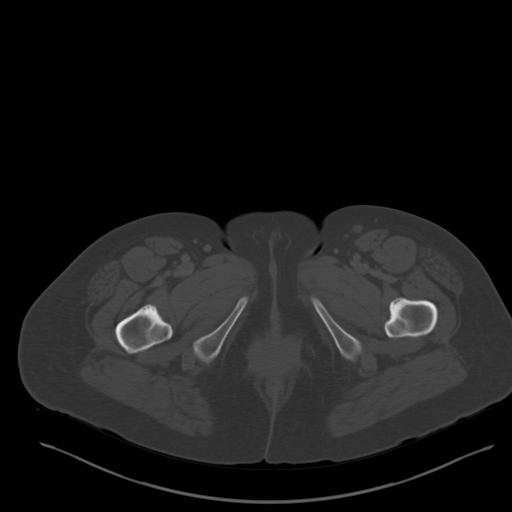
[im 10/85  soft-tissue]
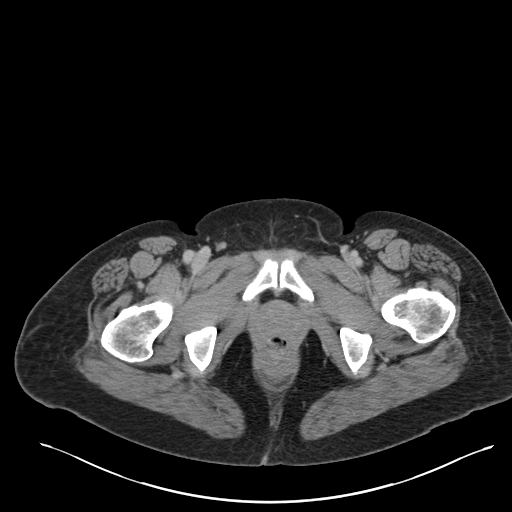
[im 20/85  soft-tissue]
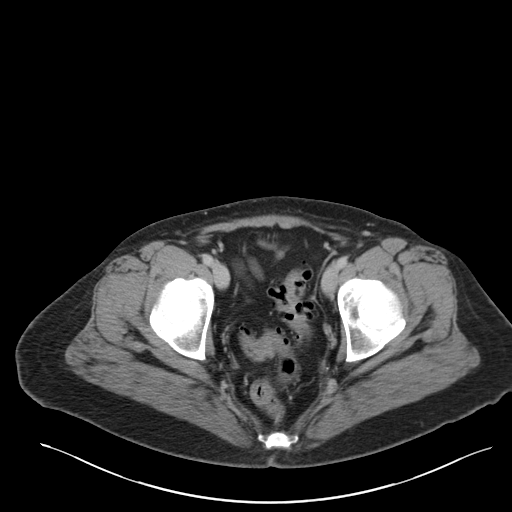
[im 25/85  soft-tissue]
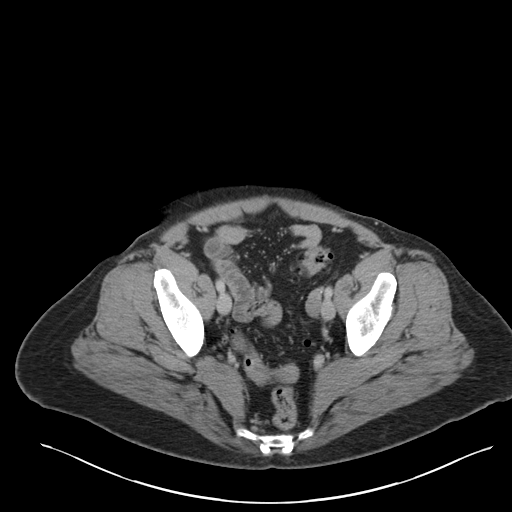
[im 30/85  soft-tissue]
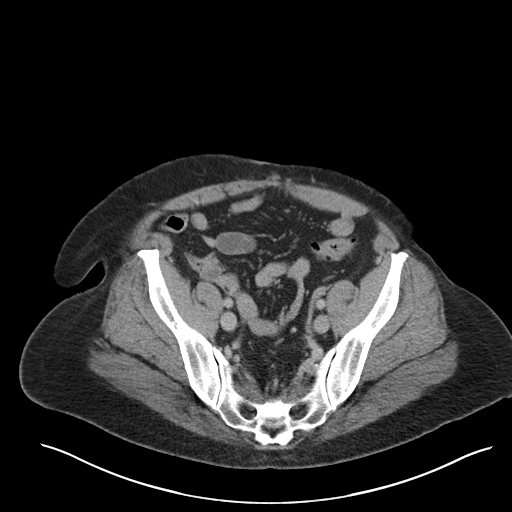
[im 35/85  soft-tissue]
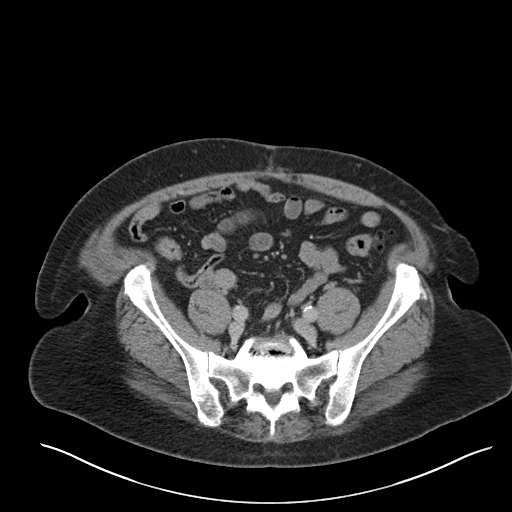
[im 45/85  soft-tissue]
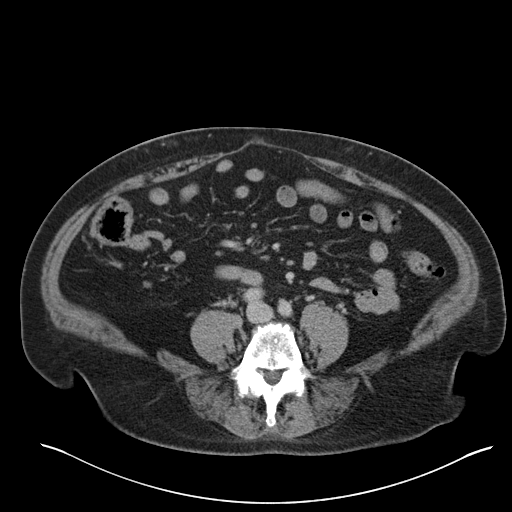
[im 50/85  soft-tissue]
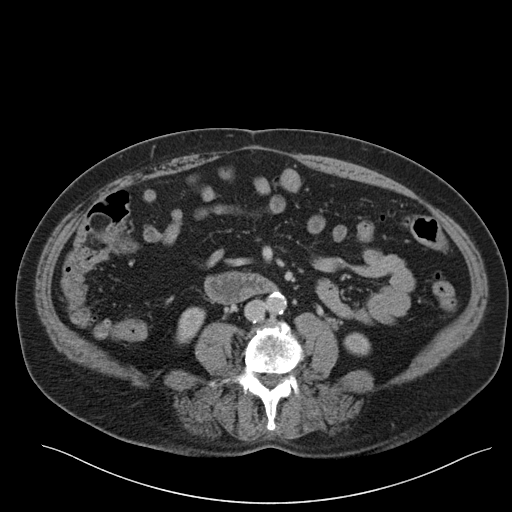
[im 55/85  soft-tissue]
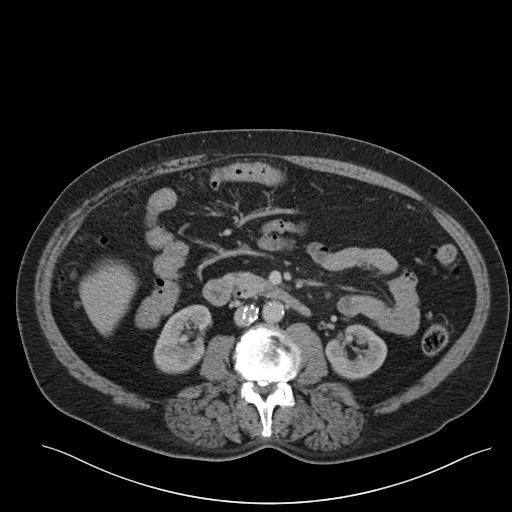
[im 55/85  bone]
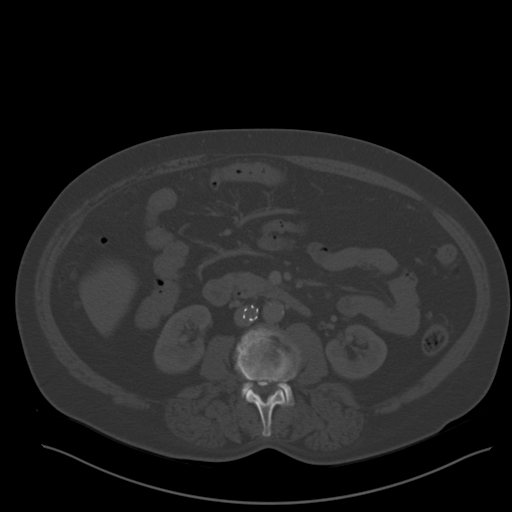
[im 60/85  soft-tissue]
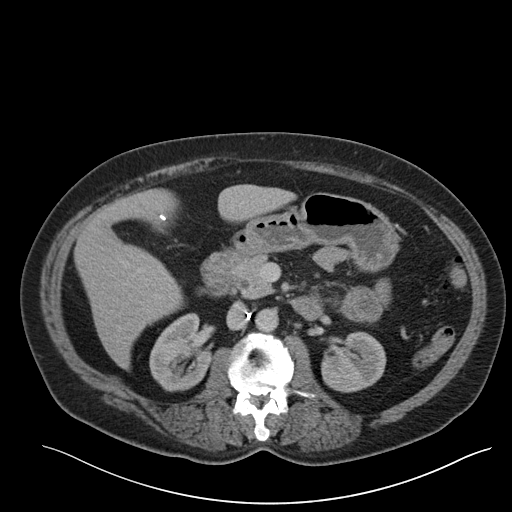
[im 65/85  soft-tissue]
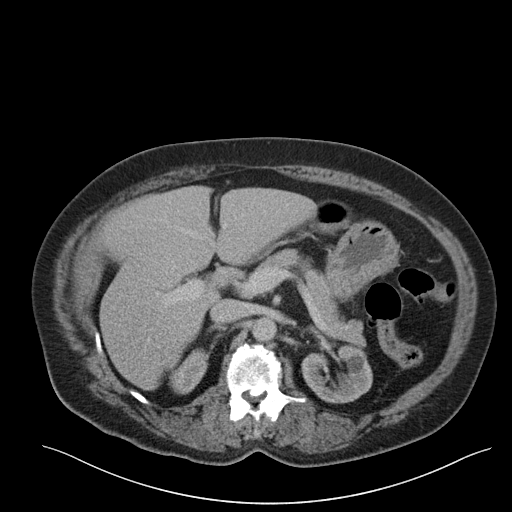
[im 75/85  soft-tissue]
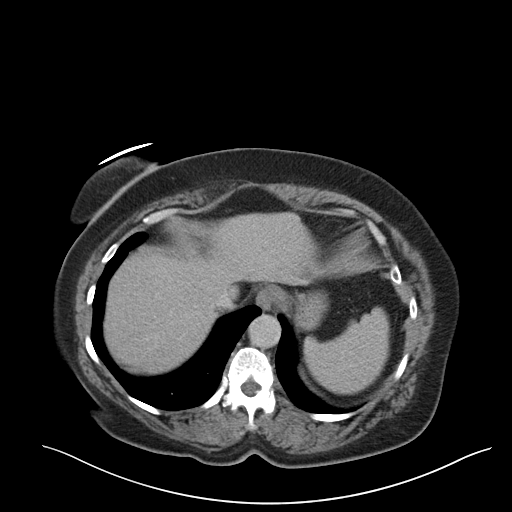
[im 80/85  soft-tissue]
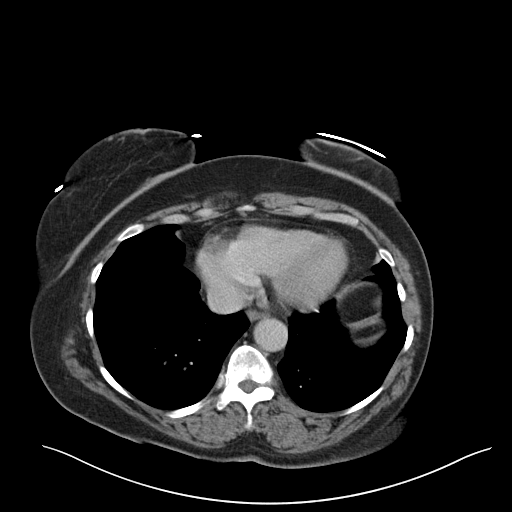

[Series 5: coronal st · coronal · 0.81mm/px · 3 of 104 slices shown]
[im 35/104  soft-tissue]
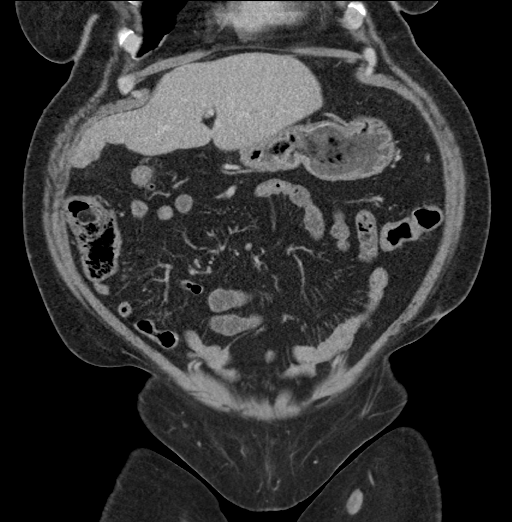
[im 46/104  soft-tissue]
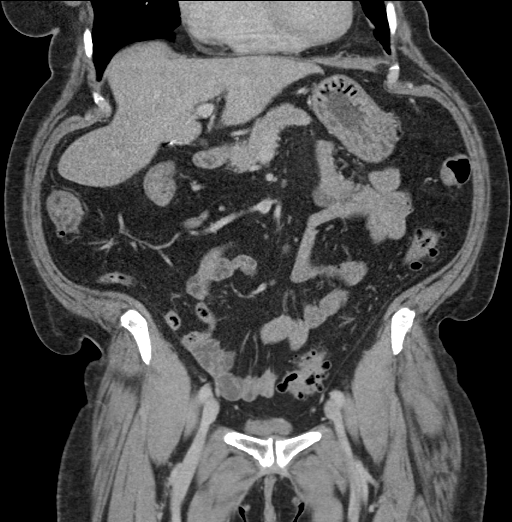
[im 58/104  soft-tissue]
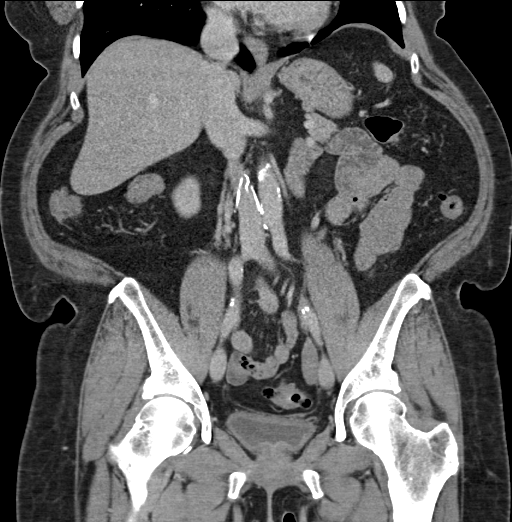

[16 of 46 positions shown; findings below may reference images not displayed]

FINDINGS: Lower chest: Mild scattered bibasilar atelectatic changes.
Visualized lung bases are otherwise clear.

Hepatobiliary: Liver demonstrates a normal contrast enhanced
appearance. Gallbladder surgically absent. No biliary dilatation.

Pancreas: Pancreas within normal limits.

Spleen: Spleen within normal limits.

Adrenals/Urinary Tract: Adrenal glands are normal. Kidneys equal
size with symmetric enhancement. Few small cysts noted within left
kidney. No nephrolithiasis, hydronephrosis, or focal enhancing renal
mass. No hydroureter. Bladder largely decompressed without acute
abnormality. Mild circumferential bladder wall thickening likely
related incomplete distension.

Stomach/Bowel: Small hiatal hernia. Stomach otherwise unremarkable.
No evidence for bowel obstruction. Appendix is normal. Colonic
diverticulosis without evidence for acute diverticulitis. No acute
inflammatory changes seen about the bowels.

Vascular/Lymphatic: Mild aorto bi-iliac atherosclerotic disease. No
aneurysm. Normal intravascular enhancement seen throughout the
intra-abdominal aorta. Mesenteric vessels patent proximally. No
adenopathy. IVC filter in place.

Reproductive: Uterus is absent. Left ovary within normal limits.
Right ovary not discretely identified.

Other: No free air or fluid. Postsurgical changes noted at the
ventral abdominal wall.

Musculoskeletal: No acute osseous abnormality. No worrisome lytic or
blastic osseous lesions. Advanced degenerative changes noted
throughout the lumbar spine.
IMPRESSION: 1. No acute intra-abdominal or pelvic process identified.
2. Mild diffuse circumferential bladder wall thickening, most like
related incomplete distension. Correlation with urinalysis
recommended as acute cystitis could also have this appearance.
3. Colonic diverticulosis without evidence for acute diverticulitis.
4. Status post cholecystectomy and hysterectomy.
5. IVC filter in place.
